# Patient Record
Sex: Female | Born: 1955 | Hispanic: No | Marital: Married | Smoking: Never smoker
Health system: Southern US, Community
[De-identification: ages and names within clinical notes are randomized; demographics above are authoritative.]

## PROBLEM LIST (undated history)

## (undated) DIAGNOSIS — E669 Obesity, unspecified: Secondary | ICD-10-CM

## (undated) DIAGNOSIS — I429 Cardiomyopathy, unspecified: Secondary | ICD-10-CM

## (undated) DIAGNOSIS — I4819 Other persistent atrial fibrillation: Secondary | ICD-10-CM

## (undated) DIAGNOSIS — I10 Essential (primary) hypertension: Secondary | ICD-10-CM

## (undated) HISTORY — PX: TONSILECTOMY, ADENOIDECTOMY, BILATERAL MYRINGOTOMY AND TUBES: SHX2538

## (undated) HISTORY — DX: Essential (primary) hypertension: I10

## (undated) HISTORY — DX: Cardiomyopathy, unspecified: I42.9

## (undated) HISTORY — DX: Obesity, unspecified: E66.9

## (undated) HISTORY — DX: Other persistent atrial fibrillation: I48.19

---

## 2020-06-09 ENCOUNTER — Other Ambulatory Visit: Payer: Self-pay

## 2020-06-09 ENCOUNTER — Encounter (HOSPITAL_COMMUNITY): Payer: Self-pay | Admitting: Cardiology

## 2020-06-09 ENCOUNTER — Inpatient Hospital Stay (HOSPITAL_COMMUNITY)
Admission: AD | Admit: 2020-06-09 | Discharge: 2020-06-12 | DRG: 308 | Disposition: A | Discharge status: Home / Self Care | Payer: Medicare Other | Attending: Cardiology | Admitting: Cardiology

## 2020-06-09 ENCOUNTER — Emergency Department (HOSPITAL_COMMUNITY): Payer: Medicare Other

## 2020-06-09 DIAGNOSIS — R7309 Other abnormal glucose: Secondary | ICD-10-CM | POA: Diagnosis not present

## 2020-06-09 DIAGNOSIS — I361 Nonrheumatic tricuspid (valve) insufficiency: Secondary | ICD-10-CM | POA: Diagnosis not present

## 2020-06-09 DIAGNOSIS — I4891 Unspecified atrial fibrillation: Secondary | ICD-10-CM | POA: Diagnosis present

## 2020-06-09 DIAGNOSIS — R197 Diarrhea, unspecified: Secondary | ICD-10-CM | POA: Diagnosis present

## 2020-06-09 DIAGNOSIS — I5041 Acute combined systolic (congestive) and diastolic (congestive) heart failure: Secondary | ICD-10-CM | POA: Diagnosis present

## 2020-06-09 DIAGNOSIS — Z6841 Body Mass Index (BMI) 40.0 and over, adult: Secondary | ICD-10-CM

## 2020-06-09 DIAGNOSIS — I248 Other forms of acute ischemic heart disease: Secondary | ICD-10-CM | POA: Diagnosis present

## 2020-06-09 DIAGNOSIS — D72829 Elevated white blood cell count, unspecified: Secondary | ICD-10-CM | POA: Diagnosis not present

## 2020-06-09 DIAGNOSIS — I2489 Other forms of acute ischemic heart disease: Secondary | ICD-10-CM

## 2020-06-09 DIAGNOSIS — I429 Cardiomyopathy, unspecified: Secondary | ICD-10-CM | POA: Diagnosis present

## 2020-06-09 DIAGNOSIS — R0602 Shortness of breath: Secondary | ICD-10-CM

## 2020-06-09 DIAGNOSIS — R739 Hyperglycemia, unspecified: Secondary | ICD-10-CM

## 2020-06-09 DIAGNOSIS — Z20822 Contact with and (suspected) exposure to covid-19: Secondary | ICD-10-CM | POA: Diagnosis present

## 2020-06-09 DIAGNOSIS — E1165 Type 2 diabetes mellitus with hyperglycemia: Secondary | ICD-10-CM | POA: Diagnosis present

## 2020-06-09 DIAGNOSIS — R001 Bradycardia, unspecified: Secondary | ICD-10-CM | POA: Diagnosis not present

## 2020-06-09 DIAGNOSIS — N3000 Acute cystitis without hematuria: Secondary | ICD-10-CM | POA: Diagnosis not present

## 2020-06-09 DIAGNOSIS — N390 Urinary tract infection, site not specified: Secondary | ICD-10-CM | POA: Diagnosis present

## 2020-06-09 DIAGNOSIS — I340 Nonrheumatic mitral (valve) insufficiency: Secondary | ICD-10-CM | POA: Diagnosis not present

## 2020-06-09 DIAGNOSIS — R0682 Tachypnea, not elsewhere classified: Secondary | ICD-10-CM | POA: Diagnosis not present

## 2020-06-09 LAB — CBC WITH DIFFERENTIAL/PLATELET
Abs Immature Granulocytes: 0.06 10*3/uL (ref 0.00–0.07)
Basophils Absolute: 0.1 10*3/uL (ref 0.0–0.1)
Basophils Relative: 1 %
Eosinophils Absolute: 0.1 10*3/uL (ref 0.0–0.5)
Eosinophils Relative: 1 %
HCT: 43.5 % (ref 36.0–46.0)
Hemoglobin: 13.2 g/dL (ref 12.0–15.0)
Immature Granulocytes: 1 %
Lymphocytes Relative: 8 %
Lymphs Abs: 1 10*3/uL (ref 0.7–4.0)
MCH: 27.7 pg (ref 26.0–34.0)
MCHC: 30.3 g/dL (ref 30.0–36.0)
MCV: 91.4 fL (ref 80.0–100.0)
Monocytes Absolute: 0.6 10*3/uL (ref 0.1–1.0)
Monocytes Relative: 5 %
Neutro Abs: 10.6 10*3/uL — ABNORMAL HIGH (ref 1.7–7.7)
Neutrophils Relative %: 84 %
Platelets: 349 10*3/uL (ref 150–400)
RBC: 4.76 MIL/uL (ref 3.87–5.11)
RDW: 15.4 % (ref 11.5–15.5)
WBC: 12.5 10*3/uL — ABNORMAL HIGH (ref 4.0–10.5)
nRBC: 0 % (ref 0.0–0.2)

## 2020-06-09 LAB — GLUCOSE, CAPILLARY
Glucose-Capillary: 166 mg/dL — ABNORMAL HIGH (ref 70–99)
Glucose-Capillary: 191 mg/dL — ABNORMAL HIGH (ref 70–99)

## 2020-06-09 LAB — BASIC METABOLIC PANEL
Anion gap: 12 (ref 5–15)
BUN: 19 mg/dL (ref 8–23)
CO2: 21 mmol/L — ABNORMAL LOW (ref 22–32)
Calcium: 8.9 mg/dL (ref 8.9–10.3)
Chloride: 108 mmol/L (ref 98–111)
Creatinine, Ser: 0.98 mg/dL (ref 0.44–1.00)
GFR calc Af Amer: >60 mL/min (ref >60)
GFR calc non Af Amer: >60 mL/min (ref >60)
Glucose, Bld: 244 mg/dL — ABNORMAL HIGH (ref 70–99)
Potassium: 4.7 mmol/L (ref 3.5–5.1)
Sodium: 141 mmol/L (ref 135–145)

## 2020-06-09 LAB — PROTIME-INR
INR: 1.2 (ref 0.8–1.2)
Prothrombin Time: 15 seconds (ref 11.4–15.2)

## 2020-06-09 LAB — BRAIN NATRIURETIC PEPTIDE: B Natriuretic Peptide: 319.8 pg/mL — ABNORMAL HIGH (ref 0.0–100.0)

## 2020-06-09 LAB — APTT: aPTT: 24 seconds (ref 24–36)

## 2020-06-09 LAB — TROPONIN I (HIGH SENSITIVITY)
Troponin I (High Sensitivity): 53 ng/L — ABNORMAL HIGH (ref <18)
Troponin I (High Sensitivity): 83 ng/L — ABNORMAL HIGH (ref <18)

## 2020-06-09 LAB — SARS CORONAVIRUS 2 BY RT PCR (HOSPITAL ORDER, PERFORMED IN ~~LOC~~ HOSPITAL LAB): SARS Coronavirus 2: NEGATIVE

## 2020-06-09 MED ORDER — HEPARIN (PORCINE) 25000 UT/250ML-% IV SOLN
1100.0000 [IU]/h | INTRAVENOUS | Status: DC
  Administered 2020-06-09: 1100 [IU]/h via INTRAVENOUS
  Filled 2020-06-09: qty 250

## 2020-06-09 MED ORDER — DILTIAZEM LOAD VIA INFUSION
10.0000 mg | Freq: Once | INTRAVENOUS | Status: AC
  Administered 2020-06-09: 10 mg via INTRAVENOUS
  Filled 2020-06-09: qty 10

## 2020-06-09 MED ORDER — HEPARIN (PORCINE) 25000 UT/250ML-% IV SOLN: 1100.0000 [IU]/h | INTRAVENOUS | Status: DC

## 2020-06-09 MED ORDER — ACETAMINOPHEN 325 MG PO TABS: 650.0000 mg | ORAL_TABLET | ORAL | Status: DC | PRN

## 2020-06-09 MED ORDER — DILTIAZEM HCL-DEXTROSE 125-5 MG/125ML-% IV SOLN (PREMIX)
5.0000 mg/h | INTRAVENOUS | Status: DC
  Administered 2020-06-09: 5 mg/h via INTRAVENOUS
  Filled 2020-06-09 (×2): qty 125

## 2020-06-09 MED ORDER — ONDANSETRON HCL 4 MG/2ML IJ SOLN: 4.0000 mg | Freq: Four times a day (QID) | INTRAMUSCULAR | Status: DC | PRN

## 2020-06-09 MED ORDER — HEPARIN BOLUS VIA INFUSION
4000.0000 [IU] | Freq: Once | INTRAVENOUS | Status: AC
  Administered 2020-06-09: 4000 [IU] via INTRAVENOUS
  Filled 2020-06-09: qty 4000

## 2020-06-09 MED ORDER — APIXABAN 5 MG PO TABS
5.0000 mg | ORAL_TABLET | Freq: Two times a day (BID) | ORAL | Status: DC
  Administered 2020-06-09 – 2020-06-12 (×7): 5 mg via ORAL
  Filled 2020-06-09 (×7): qty 1

## 2020-06-09 MED ORDER — METOPROLOL TARTRATE 25 MG PO TABS
25.0000 mg | ORAL_TABLET | Freq: Four times a day (QID) | ORAL | Status: DC
  Administered 2020-06-09 – 2020-06-10 (×3): 25 mg via ORAL
  Filled 2020-06-09 (×3): qty 1

## 2020-06-09 NOTE — H&P (Signed)
Cardiology Admission History and Physical:   Patient ID: Anita Stanton MRN: [id number]; DOB: Aug 23, 1956   Admission date: 06/09/2020  Primary Care Provider: Patient, No Pcp Per [facility] Cardiologist: Jodelle Red, MD [facility] Electrophysiologist:  None   Chief Complaint:  Atrial fibrillation with RVR  Patient Profile:   Anita Stanton is a 64 y.o. female without significant PMH who presented in atrial fibrillation with RVR  History of Present Illness:   Ms. Anita Stanton is without significant past medical history. She presented after 4 days of fatigue and shortness of breath. She attributed this to changing her cat's clay litter box, but on arrival she was found to be in atrial fibrillation with RVR. She denies prior cardiac history.  She has has shortness of breath, nausea, diarrhea, and fatigue for several days. Her symptoms worsened overnight, and she called EMS. She reports being given adenosine x2 with only brief improvement in her heart rate. Per EMS notes, this documented that her rhythm was atrial fibrillation and not SVT or flutter.    History reviewed. No pertinent past medical history.  History reviewed. No pertinent surgical history.   Medications Prior to Admission: Prior to Admission medications   Medication Sig Start Date End Date Taking? Authorizing Provider  Ascorbic Acid (VITAMIN C) 1000 MG tablet Take 1,000 mg by mouth daily.   Yes [provider]  B Complex Vitamins (VITAMIN-B COMPLEX PO) Take 1 tablet by mouth daily.   Yes [provider]  Lactobacillus (PROBIOTIC ACIDOPHILUS) CAPS Take 1 capsule by mouth daily.   Yes [provider]  Magnesium 100 MG TABS Take 100 mg by mouth daily.   Yes [provider]  Melatonin 2.5 MG CHEW Chew 1.25 mg by mouth at bedtime.   Yes [provider]  Zinc 10 MG LOZG Use as directed 10 mg in the mouth or throat daily as needed (cold symptoms).   Yes  [provider]     Allergies:   No Known Allergies  Social History:   Social History   Tobacco Use  . Smoking status: Not on file  Substance Use Topics  . Alcohol use: Not on file  . Drug use: Not on file   Family History:   No history of premature CAD  ROS:  Please see the history of present illness.  Constitutional: Negative for chills, fever, night sweats, unintentional weight loss  HENT: Negative for ear pain and hearing loss.   Eyes: Negative for loss of vision and eye pain.  Respiratory: Negative for sputum, wheezing.  Positive for cough Cardiovascular: See HPI. Gastrointestinal: Negative for abdominal pain, melena, and hematochezia. Positive for nausea and diarrhea Genitourinary: Negative for dysuria and hematuria.  Musculoskeletal: Negative for falls and myalgias.  Skin: Negative for itching and rash.  Neurological: Negative for focal weakness, focal sensory changes and loss of consciousness.  Endo/Heme/Allergies: Does not bruise/bleed easily.  All other ROS reviewed and negative.     Physical Exam/Data:   Vitals:   06/09/20 1203 06/09/20 1204 06/09/20 1205 06/09/20 1206  BP:      Pulse: (!) 135 (!) 141 (!) 136 (!) 146  Resp:      Temp:      TempSrc:      SpO2: 94% 96% 94% 94%  Weight:      Height:       No intake or output data in the 24 hours ending 06/09/20 1251 Last 3 Weights 06/09/2020  Weight (lbs) 215 lb  Weight (kg) 97.523 kg     Body mass index is 39.32 kg/m.  General:  Well nourished, well developed, in no acute distress HEENT: normal Lymph: no adenopathy Neck: no JVD Endocrine:  No thryomegaly Vascular: No carotid bruits; RA pulses 2+ bilaterally Cardiac:  normal S1, S2; tachycardic and irregular; no murmur appreciated Lungs:  clear to auscultation bilaterally, no wheezing, rhonchi or rales  Abd: soft, nontender, no hepatomegaly  Ext: no edema Musculoskeletal:  No deformities, BUE and BLE strength normal and equal Skin: warm  and dry  Neuro:  CNs 2-12 intact, no focal abnormalities noted Psych:  Normal affect    EKG:  The ECG that was done in the ER was personally reviewed and demonstrates afib RVR at 158 bpm  Relevant CV Studies: None  Laboratory Data:  High Sensitivity Troponin:   Recent Labs  Lab 06/09/20 0611 06/09/20 0803  TROPONINIHS 53* 83*      Chemistry Recent Labs  Lab 06/09/20 0611  NA 141  K 4.7  CL 108  CO2 21*  GLUCOSE 244*  BUN 19  CREATININE 0.98  CALCIUM 8.9  GFRNONAA >60  GFRAA >60  ANIONGAP 12    No results for input(s): PROT, ALBUMIN, AST, ALT, ALKPHOS, BILITOT in the last 168 hours. Hematology Recent Labs  Lab 06/09/20 0611  WBC 12.5*  RBC 4.76  HGB 13.2  HCT 43.5  MCV 91.4  MCH 27.7  MCHC 30.3  RDW 15.4  PLT 349   BNP Recent Labs  Lab 06/09/20 0611  BNP 319.8*    DDimer No results for input(s): DDIMER in the last 168 hours.   Radiology/Studies:  DG Chest Port 1 View  Result Date: 06/09/2020 CLINICAL DATA:  Shortness of breath EXAM: PORTABLE CHEST 1 VIEW COMPARISON:  None. FINDINGS: Lungs are clear. Heart is mildly enlarged with pulmonary vascularity normal. No adenopathy. Aorta is mildly tortuous. No bone lesions. IMPRESSION: Mild cardiac enlargement. Tortuous aorta; question chronic hypertension. No edema or airspace opacity. Electronically Signed   By: Lowella Grip III M.D.   On: 06/09/2020 07:52    Assessment and Plan:   Atrial fibrillation with RVR: new onset -transition from heparin drip to apixaban -on diltiazem drip, titrate to heart rate <90. -will add metoprolol. Has plenty of blood pressure room -discussed atrial fibrillation at length: Afib is abnormal rhythm of the top chamber of the heart. It can be triggered by many different things, including stress, infection, surgery, age, etc. The rhythm itself, when not fast, is not life threatening. We make sure the heart rate is not fast, and we talk about the risk of small blood clots  forming in the heart. When afib lasts for >48 hours, there is a risk of small clots forming and breaking loose, causing a stroke. We prevent this with blood thinning medication, but these medications also increase risk for bleeding. We discuss the balance between bleeding and stroke with each individual patient. -we discussed the difference between rate and rhythm control at length. Given the duration of her symptoms, suspect she has been in afib at least several days. Was not on anticoagulation prior. Will aim for rate control -CHA2DS2/VAS Stroke Risk Points= 2  -we will plan for TEE-CV on Monday -echo when heart rate is slower  Nausea, diarrhea, shortness of breath -unclear if an illness precipitated her afib, or if the afib resulted in her symptoms -concern for Covid given the constellation, denies fever -Covid swab pending.  Elevated troponin: low level, suspect demand ischemia  in the setting of atrial fibrillation with RVR  Severity of Illness: The appropriate patient status for this patient is INPATIENT. Inpatient status is judged to be reasonable and necessary in order to provide the required intensity of service to ensure the patient's safety. The patient's presenting symptoms, physical exam findings, and initial radiographic and laboratory data in the context of their chronic comorbidities is felt to place them at high risk for further clinical deterioration. Furthermore, it is not anticipated that the patient will be medically stable for discharge from the hospital within 2 midnights of admission. The following factors support the patient status of inpatient.   " The patient's presenting symptoms include nausea, shortness of breath, tachycardia. " The worrisome physical exam findings include rapid heart rate and shortness of breath. " The initial radiographic and laboratory data are worrisome because of likely demand ischemia. " The chronic co-morbidities include none.   * I certify  that at the point of admission it is my clinical judgment that the patient will require inpatient hospital care spanning beyond 2 midnights from the point of admission due to high intensity of service, high risk for further deterioration and high frequency of surveillance required.*    For questions or updates, please contact [facility] Please consult [email or web address] for contact info under     Signed, Jodelle Red, MD  06/09/2020 12:51 PM

## 2020-06-09 NOTE — ED Notes (Signed)
Pt. Transported via wheelchair to restroom.

## 2020-06-09 NOTE — Progress Notes (Signed)
ANTICOAGULATION CONSULT NOTE - Initial Consult  Pharmacy Consult for heparin Indication: atrial fibrillation  Patient Measurements: Height: 5\' 2"  (157.5 cm) Weight: 97.5 kg (215 lb) IBW/kg (Calculated) : 50.1 Heparin Dosing Weight: 75kg  Vital Signs: Temp: 97.9 F (36.6 C) (06/12 0616) Temp Source: Oral (06/12 0616) BP: 154/139 (06/12 0616) Pulse Rate: 159 (06/12 [birth year]1970)   Assessment: 64yo female with no PMH c/o SOB x3d that worsened overnight, found to be in Afib w/ RVR to the 190s, to begin heparin.  Goal of Therapy:  Heparin level 0.3-0.7 units/ml Monitor platelets by anticoagulation protocol: Yes   Plan:  Will give heparin 4000 units IV bolus x1 followed by gtt at 1100 units/hr and monitor heparin levels and CBC.  63yo, PharmD, BCPS  06/09/2020,6:25 AM

## 2020-06-09 NOTE — ED Triage Notes (Signed)
Transported via [facility]. Pt. C/o Sob x3, said she thinks it is caused by her cats litter. Pt. Currently A&O x4, tachy HR 140  Denies nausea

## 2020-06-09 NOTE — Progress Notes (Signed)
Cardizem drip stopped due to HR dropping into the 40's briefly on telemetry then coming back up to 60's. Now maintaining HR in the 60's.

## 2020-06-09 NOTE — Progress Notes (Signed)
Heart rate is now in afib in the 60's-70's and bp down to 114/88, decreased Cardizem gtt to 5 mg/hr.

## 2020-06-09 NOTE — ED Notes (Signed)
Cardiologist at bedside.  

## 2020-06-09 NOTE — ED Provider Notes (Signed)
[facility] EMERGENCY DEPARTMENT Provider Note   CSN: [id number] Arrival date & time: 06/09/20  4315     History Chief Complaint  Patient presents with  . Shortness of Breath    Anita Stanton is a 64 y.o. female.  Patient presents to the emergency department for evaluation of shortness of breath.  Patient has noticed shortness of breath for at least the last 3 days.  Symptoms worsened tonight so she called EMS.  EMS found the patient to be exhibiting significant tachycardia, heart rate to 190 bpm.  Patient administered adenosine IV push for possible SVT which revealed the rhythm to be atrial fibrillation with rapid ventricular response.  Patient denies chest pain.  She has had nausea and vomiting associated with her symptoms.        No past medical history on file.  There are no problems to display for this patient.      OB History   No obstetric history on file.     No family history on file.  Social History   Tobacco Use  . Smoking status: Not on file  Substance Use Topics  . Alcohol use: Not on file  . Drug use: Not on file    Home Medications Prior to Admission medications   Not on File    Allergies    Patient has no allergy information on record.  Review of Systems   Review of Systems  Respiratory: Positive for shortness of breath.   Gastrointestinal: Positive for nausea and vomiting.  All other systems reviewed and are negative.   Physical Exam Updated Vital Signs BP (!) 154/139 (BP Location: Right Arm)   Pulse (!) 159   Temp 97.9 F (36.6 C) (Oral)   Resp (!) 21   Ht 5\' 2"  (1.575 m)   Wt 97.5 kg   SpO2 95%   BMI 39.32 kg/m   Physical Exam Vitals and nursing note reviewed.  Constitutional:      General: She is not in acute distress.    Appearance: Normal appearance. She is well-developed.  HENT:     Head: Normocephalic and atraumatic.     Right Ear: Hearing normal.     Left Ear: Hearing normal.     Nose:  Nose normal.  Eyes:     Conjunctiva/sclera: Conjunctivae normal.     Pupils: Pupils are equal, round, and reactive to light.  Cardiovascular:     Rate and Rhythm: Tachycardia present.     Heart sounds: S1 normal and S2 normal. No murmur heard.  No friction rub. No gallop.   Pulmonary:     Effort: Pulmonary effort is normal. No respiratory distress.     Breath sounds: Normal breath sounds.  Chest:     Chest wall: No tenderness.  Abdominal:     General: Bowel sounds are normal.     Palpations: Abdomen is soft.     Tenderness: There is no abdominal tenderness. There is no guarding or rebound. Negative signs include Murphy's sign and McBurney's sign.     Hernia: No hernia is present.  Musculoskeletal:        General: Normal range of motion.     Cervical back: Normal range of motion and neck supple.  Skin:    General: Skin is warm and dry.     Findings: No rash.  Neurological:     Mental Status: She is alert and oriented to person, place, and time.     GCS: GCS eye subscore is  4. GCS verbal subscore is 5. GCS motor subscore is 6.     Cranial Nerves: No cranial nerve deficit.     Sensory: No sensory deficit.     Coordination: Coordination normal.  Psychiatric:        Speech: Speech normal.        Behavior: Behavior normal.        Thought Content: Thought content normal.     ED Results / Procedures / Treatments   Labs (all labs ordered are listed, but only abnormal results are displayed) Labs Reviewed  CBC WITH DIFFERENTIAL/PLATELET  BASIC METABOLIC PANEL  BRAIN NATRIURETIC PEPTIDE  PROTIME-INR  APTT  TROPONIN I (HIGH SENSITIVITY)    EKG EKG Interpretation  Date/Time:  Saturday June 09 2020 06:12:53 EDT Ventricular Rate:  158 PR Interval:    QRS Duration: 94 QT Interval:  316 QTC Calculation: 503 R Axis:   -67 Text Interpretation: Atrial fibrillation with rapid V-rate Inferior infarct, old Probable anterior infarct, age indeterminate Baseline wander in lead(s) I II  III aVR aVL aVF V3 V4 V5 V6 No previous tracing Confirmed by Gilda Crease [id number]) on 06/09/2020 6:15:26 AM   Radiology No results found.  Procedures .Critical Care Performed by: Gilda Crease, MD Authorized by: Gilda Crease, MD   Critical care provider statement:    Critical care time (minutes):  35   Critical care time was exclusive of:  Separately billable procedures and treating other patients   Critical care was necessary to treat or prevent imminent or life-threatening deterioration of the following conditions: Arrhythmia.   Critical care was time spent personally by me on the following activities:  Ordering and performing treatments and interventions, ordering and review of laboratory studies, development of treatment plan with patient or surrogate, ordering and review of radiographic studies, pulse oximetry, evaluation of patient's response to treatment, re-evaluation of patient's condition, review of old charts and examination of patient   I assumed direction of critical care for this patient from another provider in my specialty: no     (including critical care time)  Medications Ordered in ED Medications  diltiazem (CARDIZEM) 1 mg/mL load via infusion 10 mg (has no administration in time range)    And  diltiazem (CARDIZEM) 125 mg in dextrose 5% 125 mL (1 mg/mL) infusion (has no administration in time range)    ED Course  I have reviewed the triage vital signs and the nursing notes.  Pertinent labs & imaging results that were available during my care of the patient were reviewed by me and considered in my medical decision making (see chart for details).    MDM Rules/Calculators/A&P                          Patient with no previous medical history presents to the emergency department for evaluation of shortness of breath.  Patient comes to the ER from home by EMS who identified the patient to be in atrial fibrillation with a rapid ventricular  response.  Patient not experiencing any chest pain.  No obvious ischemia on EKG.  Patient exhibiting heart rate up to 190 bpm.  She has not hypotensive or unstable.  Will initiate Cardizem drip.  Patient adamantly refuses Covid testing.  CHADS-VASc Score for Atrial Fibrillation Stroke Risk from [email or web address]  on 06/09/2020 ** All calculations should be rechecked by clinician prior to use **  RESULT SUMMARY: 2 points Stroke risk was 2.2% per year in >90,000  patients (the [place] Atrial Fibrillation Cohort Study) and 2.9% risk of stroke/TIA/systemic embolism.  One recommendation suggests a 0 score is "low" risk and may not require anticoagulation; a 1 score is "low-moderate" risk and should consider antiplatelet or anticoagulation, and score 2 or greater is "moderate-high" risk and should otherwise be an anticoagulation candidate.   INPUTS: Age --> 1 = 65-74 Sex --> 1 = Female CHF history --> 0 = No Hypertension history --> 0 = No Stroke/TIA/thromboembolism history --> 0 = No Vascular disease history (prior MI, peripheral artery disease, or aortic plaque) --> 0 = No Diabetes history --> 0 = No   This patients CHA2DS2-VASc Score and unadjusted Ischemic Stroke Rate (% per year) is equal to 2.2 % stroke rate/year from a score of 2  Above score calculated as 1 point each if present [CHF, HTN, DM, Vascular=MI/PAD/Aortic Plaque, Age if 65-74, or Female] Above score calculated as 2 points each if present [Age > 75, or Stroke/TIA/TE]  Final Clinical Impression(s) / ED Diagnoses Final diagnoses:  Atrial fibrillation with RVR [facility])    Rx / DC Orders ED Discharge Orders    None       Shakera Ebrahimi, Canary Brim, MD 06/09/20 [phone]

## 2020-06-10 ENCOUNTER — Inpatient Hospital Stay (HOSPITAL_COMMUNITY): Payer: Medicare Other

## 2020-06-10 DIAGNOSIS — R7309 Other abnormal glucose: Secondary | ICD-10-CM

## 2020-06-10 DIAGNOSIS — I361 Nonrheumatic tricuspid (valve) insufficiency: Secondary | ICD-10-CM

## 2020-06-10 DIAGNOSIS — I340 Nonrheumatic mitral (valve) insufficiency: Secondary | ICD-10-CM

## 2020-06-10 DIAGNOSIS — D72829 Elevated white blood cell count, unspecified: Secondary | ICD-10-CM

## 2020-06-10 DIAGNOSIS — R0682 Tachypnea, not elsewhere classified: Secondary | ICD-10-CM

## 2020-06-10 LAB — CBC
HCT: 44.3 % (ref 36.0–46.0)
Hemoglobin: 13.7 g/dL (ref 12.0–15.0)
MCH: 27.6 pg (ref 26.0–34.0)
MCHC: 30.9 g/dL (ref 30.0–36.0)
MCV: 89.3 fL (ref 80.0–100.0)
Platelets: 371 10*3/uL (ref 150–400)
RBC: 4.96 MIL/uL (ref 3.87–5.11)
RDW: 15.5 % (ref 11.5–15.5)
WBC: 15.3 10*3/uL — ABNORMAL HIGH (ref 4.0–10.5)
nRBC: 0 % (ref 0.0–0.2)

## 2020-06-10 LAB — BASIC METABOLIC PANEL
Anion gap: 9 (ref 5–15)
BUN: 28 mg/dL — ABNORMAL HIGH (ref 8–23)
CO2: 21 mmol/L — ABNORMAL LOW (ref 22–32)
Calcium: 9.2 mg/dL (ref 8.9–10.3)
Chloride: 108 mmol/L (ref 98–111)
Creatinine, Ser: 1.1 mg/dL — ABNORMAL HIGH (ref 0.44–1.00)
GFR calc Af Amer: >60 mL/min (ref >60)
GFR calc non Af Amer: 52 mL/min — ABNORMAL LOW (ref >60)
Glucose, Bld: 126 mg/dL — ABNORMAL HIGH (ref 70–99)
Potassium: 4.8 mmol/L (ref 3.5–5.1)
Sodium: 138 mmol/L (ref 135–145)

## 2020-06-10 LAB — ECHOCARDIOGRAM COMPLETE
Height: 62 in
Weight: 3562.63 oz

## 2020-06-10 LAB — LIPID PANEL
Cholesterol: 180 mg/dL (ref 0–200)
HDL: 53 mg/dL (ref >40)
LDL Cholesterol: 105 mg/dL — ABNORMAL HIGH (ref 0–99)
Total CHOL/HDL Ratio: 3.4 RATIO
Triglycerides: 109 mg/dL (ref <150)
VLDL: 22 mg/dL (ref 0–40)

## 2020-06-10 LAB — GLUCOSE, CAPILLARY
Glucose-Capillary: 125 mg/dL — ABNORMAL HIGH (ref 70–99)
Glucose-Capillary: 103 mg/dL — ABNORMAL HIGH (ref 70–99)
Glucose-Capillary: 128 mg/dL — ABNORMAL HIGH (ref 70–99)
Glucose-Capillary: 113 mg/dL — ABNORMAL HIGH (ref 70–99)

## 2020-06-10 LAB — URINALYSIS, COMPLETE (UACMP) WITH MICROSCOPIC
Bilirubin Urine: NEGATIVE
Glucose, UA: NEGATIVE mg/dL
Hgb urine dipstick: NEGATIVE
Ketones, ur: 5 mg/dL — AB
Nitrite: POSITIVE — AB
Protein, ur: 30 mg/dL — AB
Specific Gravity, Urine: 1.024 (ref 1.005–1.030)
pH: 5 (ref 5.0–8.0)

## 2020-06-10 LAB — HEMOGLOBIN A1C
Hgb A1c MFr Bld: 6 % — ABNORMAL HIGH (ref 4.8–5.6)
Mean Plasma Glucose: 125.5 mg/dL

## 2020-06-10 LAB — STREP PNEUMONIAE URINARY ANTIGEN: Strep Pneumo Urinary Antigen: NEGATIVE

## 2020-06-10 MED ORDER — INSULIN ASPART 100 UNIT/ML ~~LOC~~ SOLN: 0.0000 [IU] | Freq: Every day | SUBCUTANEOUS | Status: DC

## 2020-06-10 MED ORDER — METOPROLOL TARTRATE 50 MG PO TABS
50.0000 mg | ORAL_TABLET | Freq: Four times a day (QID) | ORAL | Status: DC
  Administered 2020-06-10 – 2020-06-11 (×4): 50 mg via ORAL
  Filled 2020-06-10 (×4): qty 1

## 2020-06-10 MED ORDER — FUROSEMIDE 10 MG/ML IJ SOLN
20.0000 mg | Freq: Once | INTRAMUSCULAR | Status: AC
  Administered 2020-06-10: 20 mg via INTRAVENOUS
  Filled 2020-06-10: qty 2

## 2020-06-10 MED ORDER — INSULIN ASPART 100 UNIT/ML ~~LOC~~ SOLN: 0.0000 [IU] | Freq: Three times a day (TID) | SUBCUTANEOUS | Status: DC

## 2020-06-10 NOTE — Progress Notes (Signed)
Progress Note  Patient Name: Anita Stanton Date of Encounter: 06/10/2020  [facility] Cardiologist: Jodelle Red, MD  Subjective   Has has isolated sugars >200, consistent with a new diagnosis of diabetes. She denies any prior history of this. Diltiazem drip stopped last night due to bradycardia. Tolerating metoprolol. Her white count is elevated to 15 today, will send strep/legionella and UA/culture. No fevers. Has a dry cough but nonproductive. No urinary symptoms.  Denies prior diagnosis of diabetes, reports recent A1c normal. Reviewed her blood sugars, insulin coverage while inpatient.  Discussed options for management. She would like to see if we can manage heart rate on medications alone vs. TEE-CV tomorrow, but she will think about it.  No chest pain, breathing improved, able to get some sleep.  Inpatient Medications    Scheduled Meds: . apixaban  5 mg Oral BID  . furosemide  20 mg Intravenous Once  . insulin aspart  0-5 Units Subcutaneous QHS  . insulin aspart  0-6 Units Subcutaneous TID WC  . metoprolol tartrate  50 mg Oral Q6H   Continuous Infusions:  PRN Meds: acetaminophen, ondansetron (ZOFRAN) IV   Vital Signs    Vitals:   06/09/20 1713 06/09/20 2022 06/10/20 0523 06/10/20 0749  BP: (!) 125/97 (!) 139/112 (!) 142/126 (!) 129/93  Pulse:  98 (!) 123 94  Resp:  20 16 16   Temp:  98.3 F (36.8 C) 98.3 F (36.8 C) (!) 97.4 F (36.3 C)  TempSrc:  Oral Oral Oral  SpO2:  98% 95% 95%  Weight:   101 kg   Height:        Intake/Output Summary (Last 24 hours) at 06/10/2020 0905 Last data filed at 06/10/2020 0400 Gross per 24 hour  Intake 1056.43 ml  Output 300 ml  Net 756.43 ml   Last 3 Weights 06/10/2020 06/09/2020 06/09/2020  Weight (lbs) 222 lb 10.6 oz 221 lb 8 oz 215 lb  Weight (kg) 101 kg 100.472 kg 97.523 kg      Telemetry    Atrial fibrillation, rates 90s-100s- Personally Reviewed  ECG    06/09/20 afib RVRV 158 bpm - Personally  Reviewed  Physical Exam   GEN: No acute distress.   O2 in place Neck: No JVD Cardiac: distant heart sounds, irregularly irregular, tachycardic Respiratory: crackles at bilateral bases today GI: Soft, nontender, non-distended  MS: No edema; No deformity. Neuro:  Nonfocal  Psych: Normal affect   Labs    High Sensitivity Troponin:   Recent Labs  Lab 06/09/20 0611 06/09/20 0803  TROPONINIHS 53* 83*      Chemistry Recent Labs  Lab 06/09/20 0611 06/10/20 0807  NA 141 138  K 4.7 4.8  CL 108 108  CO2 21* 21*  GLUCOSE 244* 126*  BUN 19 28*  CREATININE 0.98 1.10*  CALCIUM 8.9 9.2  GFRNONAA >60 52*  GFRAA >60 >60  ANIONGAP 12 9     Hematology Recent Labs  Lab 06/09/20 0611 06/10/20 0807  WBC 12.5* 15.3*  RBC 4.76 4.96  HGB 13.2 13.7  HCT 43.5 44.3  MCV 91.4 89.3  MCH 27.7 27.6  MCHC 30.3 30.9  RDW 15.4 15.5  PLT 349 371    BNP Recent Labs  Lab 06/09/20 0611  BNP 319.8*     DDimer No results for input(s): DDIMER in the last 168 hours.   Radiology    DG Chest Port 1 View  Result Date: 06/09/2020 CLINICAL DATA:  Shortness of breath EXAM: PORTABLE CHEST 1  VIEW COMPARISON:  None. FINDINGS: Lungs are clear. Heart is mildly enlarged with pulmonary vascularity normal. No adenopathy. Aorta is mildly tortuous. No bone lesions. IMPRESSION: Mild cardiac enlargement. Tortuous aorta; question chronic hypertension. No edema or airspace opacity. Electronically Signed   By: Lowella Grip III M.D.   On: 06/09/2020 07:52    Cardiac Studies   Echo pending  Patient Profile     64 y.o. female without significant PMH who presented with atrial fibrillation with RVR.  Assessment & Plan    Leukocytosis, dry cough, O2 requirement, crackles on exam -CXR clear, dry cough -no urinary symptoms, but endorses prior diarrhea -will check urine strep and legionella, UA -no fevers -nausea resolved -Covid negative -will give one time dose of IV lasix -would aim to  ambulate, wean O2 as tolerated  Elevated blood glucose -endorses prior A1c normal -glucose checks and SSI placed  Elevated troponin: -suspect demand ischemia in the setting of atrial fibrillation with RVR -echo ordered  Atrial fibrillation with RVR: new onset -continue apixaban -heart rate dipped on combination diltiazem/metoprolol. Stopped diltiazem drip -uptitrate metoprolol today to improve rate control -CHA2DS2/VAS Stroke Risk Points= 2  -we will plan for TEE-CV on Monday if we cannot rate control her. Otherwise she would be interested in rate control and return for cardioversion only in several weeks.  For questions or updates, please contact [place] Please consult [email or web address] for contact info under    Signed, Buford Dresser, MD  06/10/2020, 9:05 AM

## 2020-06-10 NOTE — Progress Notes (Signed)
*  PRELIMINARY RESULTS* Echocardiogram 2D Echocardiogram has been performed.  Stacey Drain 06/10/2020, 2:55 PM

## 2020-06-11 DIAGNOSIS — I248 Other forms of acute ischemic heart disease: Secondary | ICD-10-CM

## 2020-06-11 DIAGNOSIS — I429 Cardiomyopathy, unspecified: Secondary | ICD-10-CM

## 2020-06-11 DIAGNOSIS — N390 Urinary tract infection, site not specified: Secondary | ICD-10-CM

## 2020-06-11 DIAGNOSIS — I5041 Acute combined systolic (congestive) and diastolic (congestive) heart failure: Secondary | ICD-10-CM

## 2020-06-11 DIAGNOSIS — R739 Hyperglycemia, unspecified: Secondary | ICD-10-CM

## 2020-06-11 DIAGNOSIS — N3000 Acute cystitis without hematuria: Secondary | ICD-10-CM

## 2020-06-11 LAB — CBC
HCT: 41.1 % (ref 36.0–46.0)
Hemoglobin: 12.6 g/dL (ref 12.0–15.0)
MCH: 27.5 pg (ref 26.0–34.0)
MCHC: 30.7 g/dL (ref 30.0–36.0)
MCV: 89.5 fL (ref 80.0–100.0)
Platelets: 346 10*3/uL (ref 150–400)
RBC: 4.59 MIL/uL (ref 3.87–5.11)
RDW: 15.4 % (ref 11.5–15.5)
WBC: 12.5 10*3/uL — ABNORMAL HIGH (ref 4.0–10.5)
nRBC: 0 % (ref 0.0–0.2)

## 2020-06-11 LAB — BASIC METABOLIC PANEL
Anion gap: 10 (ref 5–15)
BUN: 38 mg/dL — ABNORMAL HIGH (ref 8–23)
CO2: 24 mmol/L (ref 22–32)
Calcium: 8.9 mg/dL (ref 8.9–10.3)
Chloride: 104 mmol/L (ref 98–111)
Creatinine, Ser: 0.89 mg/dL (ref 0.44–1.00)
GFR calc Af Amer: >60 mL/min (ref >60)
GFR calc non Af Amer: >60 mL/min (ref >60)
Glucose, Bld: 99 mg/dL (ref 70–99)
Potassium: 4.1 mmol/L (ref 3.5–5.1)
Sodium: 138 mmol/L (ref 135–145)

## 2020-06-11 LAB — GLUCOSE, CAPILLARY
Glucose-Capillary: 102 mg/dL — ABNORMAL HIGH (ref 70–99)
Glucose-Capillary: 110 mg/dL — ABNORMAL HIGH (ref 70–99)
Glucose-Capillary: 108 mg/dL — ABNORMAL HIGH (ref 70–99)
Glucose-Capillary: 147 mg/dL — ABNORMAL HIGH (ref 70–99)

## 2020-06-11 LAB — LEGIONELLA PNEUMOPHILA SEROGP 1 UR AG: L. pneumophila Serogp 1 Ur Ag: NEGATIVE

## 2020-06-11 MED ORDER — NITROFURANTOIN MONOHYD MACRO 100 MG PO CAPS
100.0000 mg | ORAL_CAPSULE | Freq: Two times a day (BID) | ORAL | Status: DC
  Administered 2020-06-11 – 2020-06-12 (×3): 100 mg via ORAL
  Filled 2020-06-11 (×3): qty 1

## 2020-06-11 MED ORDER — METOPROLOL SUCCINATE ER 100 MG PO TB24
200.0000 mg | ORAL_TABLET | Freq: Every day | ORAL | Status: DC
  Administered 2020-06-11 – 2020-06-12 (×2): 200 mg via ORAL
  Filled 2020-06-11 (×2): qty 2

## 2020-06-11 MED ORDER — FUROSEMIDE 10 MG/ML IJ SOLN
40.0000 mg | Freq: Two times a day (BID) | INTRAMUSCULAR | Status: DC
  Administered 2020-06-11 (×2): 40 mg via INTRAVENOUS
  Filled 2020-06-11 (×3): qty 4

## 2020-06-11 NOTE — Plan of Care (Signed)
  Problem: Education: Goal: Knowledge of disease or condition will improve Outcome: Progressing Goal: Understanding of medication regimen will improve Outcome: Progressing   Problem: Activity: Goal: Ability to tolerate increased activity will improve Outcome: Progressing   Problem: Cardiac: Goal: Ability to achieve and maintain adequate cardiopulmonary perfusion will improve Outcome: Progressing Remains in Afib with rate controlled. BP trends down with DBP 80's-100"s

## 2020-06-11 NOTE — Progress Notes (Signed)
Progress Note  Patient Name: Anita Stanton Date of Encounter: 06/11/2020  [facility] Cardiologist: Buford Dresser, MD  Subjective   Spent >50 minute today reviewing UA results, echo results, and options for next steps. She is feeling somewhat better, though still with dry cough and shortness of breath. No chest pain. Discussed plan at length, see below.  Inpatient Medications    Scheduled Meds: . apixaban  5 mg Oral BID  . furosemide  40 mg Intravenous BID  . insulin aspart  0-5 Units Subcutaneous QHS  . insulin aspart  0-6 Units Subcutaneous TID WC  . metoprolol succinate  200 mg Oral Daily  . nitrofurantoin (macrocrystal-monohydrate)  100 mg Oral Q12H   Continuous Infusions:  PRN Meds: acetaminophen   Vital Signs    Vitals:   06/10/20 2048 06/10/20 2339 06/11/20 0618 06/11/20 0733  BP: (!) 125/95 (!) 146/104 123/87 (!) 148/116  Pulse: (!) 52 77 80   Resp:      Temp: (!) 97.4 F (36.3 C) 97.8 F (36.6 C) 98.2 F (36.8 C) 97.7 F (36.5 C)  TempSrc: Oral Oral Oral Oral  SpO2: 96% 100% 99%   Weight:   100.7 kg   Height:        Intake/Output Summary (Last 24 hours) at 06/11/2020 0828 Last data filed at 06/11/2020 0800 Gross per 24 hour  Intake 220 ml  Output 800 ml  Net -580 ml   Last 3 Weights 06/11/2020 06/10/2020 06/09/2020  Weight (lbs) 221 lb 14.4 oz 222 lb 10.6 oz 221 lb 8 oz  Weight (kg) 100.653 kg 101 kg 100.472 kg      Telemetry    Atrial fibrillation, rates 70s-90s- Personally Reviewed  ECG    06/09/20 afib RVR 158 bpm - Personally Reviewed  Physical Exam   GEN: Well nourished, well developed in no acute distress. O2 by [place] in place HEENT: Normal, moist mucous membranes NECK: No JVD appreciated, but difficult body habitus CARDIAC: irregular rhythm, normal S1 and S2, no rubs or gallops. No murmur appreciated. VASCULAR: Radial and DP pulses 2+ bilaterally. No carotid bruits RESPIRATORY:  Bilateral basilar rales  present. ABDOMEN: Soft, non-tender, non-distended MUSCULOSKELETAL:  Moves all 4 limbs independently SKIN: Warm and dry, trivial LE edema NEUROLOGIC:  Alert and oriented x 3. No focal neuro deficits noted. PSYCHIATRIC:  Normal affect   Labs    High Sensitivity Troponin:   Recent Labs  Lab 06/09/20 0611 06/09/20 0803  TROPONINIHS 53* 83*      Chemistry Recent Labs  Lab 06/09/20 0611 06/10/20 0807 06/11/20 0510  NA 141 138 138  K 4.7 4.8 4.1  CL 108 108 104  CO2 21* 21* 24  GLUCOSE 244* 126* 99  BUN 19 28* 38*  CREATININE 0.98 1.10* 0.89  CALCIUM 8.9 9.2 8.9  GFRNONAA >60 52* >60  GFRAA >60 >60 >60  ANIONGAP 12 9 10      Hematology Recent Labs  Lab 06/09/20 0611 06/10/20 0807 06/11/20 0510  WBC 12.5* 15.3* 12.5*  RBC 4.76 4.96 4.59  HGB 13.2 13.7 12.6  HCT 43.5 44.3 41.1  MCV 91.4 89.3 89.5  MCH 27.7 27.6 27.5  MCHC 30.3 30.9 30.7  RDW 15.4 15.5 15.4  PLT 349 371 346    BNP Recent Labs  Lab 06/09/20 0611  BNP 319.8*     DDimer No results for input(s): DDIMER in the last 168 hours.   Radiology    ECHOCARDIOGRAM COMPLETE  Result Date: 06/10/2020  ECHOCARDIOGRAM REPORT   Patient Name:   Anita Stanton Date of Exam: 06/10/2020 Medical Rec #:  [id number]           Height:       62.0 in Accession #:    [id number]          Weight:       222.7 lb Date of Birth:  08/12/1956           BSA:          2.001 m Patient Age:    64 years            BP:           129/93 mmHg Patient Gender: F                   HR:           94 bpm. Exam Location:  Inpatient Procedure: 2D Echo, Cardiac Doppler and Color Doppler Indications:    Atrial Fibrillation 427.31 / I48.91  History:        Patient has no prior history of Echocardiogram examinations.                 Arrythmias:Atrial Fibrillation.  Sonographer:    Celesta Gentile RCS Referring Phys: [id number] Keyton Bhat IMPRESSIONS  1. LVEF is severely depressed The proximal portion of LV contracts an distal 2/3 is severely  hypokinetic/akinetic . Left ventricular ejection fraction, by estimation, is 25%%. The left ventricle has severely decreased function. The left ventricle demonstrates regional wall motion abnormalities (see scoring diagram/findings for description). There is mild left ventricular hypertrophy. Left ventricular diastolic parameters are indeterminate.  2. Proximal portion of RV contracts; mid/distal is akinetic/dyskinetic.. Right ventricular systolic function is severely reduced. The right ventricular size is normal. There is mildly elevated pulmonary artery systolic pressure.  3. Left atrial size was moderately dilated.  4. Right atrial size was moderately dilated.  5. The mitral valve is abnormal. Mild mitral valve regurgitation.  6. The aortic valve is abnormal. Aortic valve regurgitation is trivial. Mild aortic valve sclerosis is present, with no evidence of aortic valve stenosis.  7. The inferior vena cava is dilated in size with <50% respiratory variability, suggesting right atrial pressure of 15 mmHg. FINDINGS  Left Ventricle: LVEF is severely depressed The proximal portion of LV contracts an distal 2/3 is severely hypokinetic/akinetic. Left ventricular ejection fraction, by estimation, is 25%%. The left ventricle has severely decreased function. The left ventricle demonstrates regional wall motion abnormalities. The left ventricular internal cavity size was normal in size. There is mild left ventricular hypertrophy. Left ventricular diastolic parameters are indeterminate. Right Ventricle: Proximal portion of RV contracts; mid/distal is akinetic/dyskinetic. The right ventricular size is normal. Right vetricular wall thickness was not assessed. Right ventricular systolic function is severely reduced. There is mildly elevated pulmonary artery systolic pressure. The tricuspid regurgitant velocity is 2.46 m/s, and with an assumed right atrial pressure of 15 mmHg, the estimated right ventricular systolic pressure is  39.2 mmHg. Left Atrium: Left atrial size was moderately dilated. Right Atrium: Right atrial size was moderately dilated. Pericardium: There is no evidence of pericardial effusion. Mitral Valve: The mitral valve is abnormal. There is mild thickening of the mitral valve leaflet(s). Mild mitral valve regurgitation. Tricuspid Valve: The tricuspid valve is normal in structure. Tricuspid valve regurgitation is mild. Aortic Valve: The aortic valve is abnormal. Aortic valve regurgitation is trivial. Aortic regurgitation PHT measures 552 msec. Mild aortic valve sclerosis is  present, with no evidence of aortic valve stenosis. Pulmonic Valve: The pulmonic valve was normal in structure. Pulmonic valve regurgitation is not visualized. Aorta: The aortic root and ascending aorta are structurally normal, with no evidence of dilitation. Venous: The inferior vena cava is dilated in size with less than 50% respiratory variability, suggesting right atrial pressure of 15 mmHg. IAS/Shunts: No atrial level shunt detected by color flow Doppler.  LEFT VENTRICLE PLAX 2D LVIDd:         4.90 cm LVIDs:         4.00 cm LV PW:         1.10 cm LV IVS:        1.40 cm LVOT diam:     2.40 cm LV SV:         45 LV SV Index:   22 LVOT Area:     4.52 cm  LV Volumes (MOD) LV vol d, MOD A2C: 79.2 ml LV vol d, MOD A4C: 90.9 ml LV vol s, MOD A2C: 44.4 ml LV vol s, MOD A4C: 51.8 ml LV SV MOD A2C:     34.8 ml LV SV MOD A4C:     90.9 ml LV SV MOD BP:      37.0 ml RIGHT VENTRICLE TAPSE (M-mode): 1.5 cm LEFT ATRIUM             Index       RIGHT ATRIUM           Index LA diam:        3.40 cm 1.70 cm/m  RA Area:     19.50 cm LA Vol (A2C):   99.7 ml 49.83 ml/m RA Volume:   51.60 ml  25.79 ml/m LA Vol (A4C):   86.2 ml 43.08 ml/m LA Biplane Vol: 96.9 ml 48.43 ml/m  AORTIC VALVE LVOT Vmax:   66.60 cm/s LVOT Vmean:  45.000 cm/s LVOT VTI:    0.099 m AI PHT:      552 msec  AORTA Ao Root diam: 3.70 cm MITRAL VALVE               TRICUSPID VALVE MV Area (PHT): 5.02  cm    TR Peak grad:   24.2 mmHg MV Decel Time: 151 msec    TR Vmax:        246.00 cm/s MV E velocity: 94.00 cm/s                            SHUNTS                            Systemic VTI:  0.10 m                            Systemic Diam: 2.40 cm Dietrich Pates MD Electronically signed by Dietrich Pates MD Signature Date/Time: 06/10/2020/4:08:22 PM    Final     Cardiac Studies   Echo 06/11/20  1. LVEF is severely depressed The proximal portion of LV contracts an  distal 2/3 is severely hypokinetic/akinetic . Left ventricular ejection  fraction, by estimation, is 25%%. The left ventricle has severely  decreased function. The left ventricle  demonstrates regional wall motion abnormalities (see scoring  diagram/findings for description). There is mild left ventricular  hypertrophy. Left ventricular diastolic parameters are indeterminate.  2. Proximal portion of RV contracts; mid/distal is akinetic/dyskinetic.Marland Kitchen  Right ventricular systolic function is severely reduced. The right  ventricular size is normal. There is mildly elevated pulmonary artery  systolic pressure.  3. Left atrial size was moderately dilated.  4. Right atrial size was moderately dilated.  5. The mitral valve is abnormal. Mild mitral valve regurgitation.  6. The aortic valve is abnormal. Aortic valve regurgitation is trivial.  Mild aortic valve sclerosis is present, with no evidence of aortic valve  stenosis.  7. The inferior vena cava is dilated in size with <50% respiratory  variability, suggesting right atrial pressure of 15 mmHg.   Patient Profile     64 y.o. female without significant PMH who presented with atrial fibrillation with RVR. Found to have new cardiomyopathy.  Assessment & Plan    Leukocytosis, dry cough, O2 requirement, crackles on exam -white count down this AM -strep pneumo negative -CXR clear, dry cough, but crackles on exam -legionella pending -urine culture pending -UA with positive nitrite,  small leukocytes, small protein/ketones, many bacteria. Consistent with urinary tract infection. Does not have other symptoms/high risk factors, but given that she is hospitalized, discussed recommended antibiotics -discussed treatment options, she would prefer most mild antibiotic with least systemic effects. Will start macrobid, discussed side effects, change based on culture if needed -no fevers -nausea resolved -Covid negative -see below re: volume overload  New cardiomyopathy, acute systolic and diastolic heart failure -We reviewed echo results at length. We discussed possible etiologies of reduced ejection fraction, specifically discussing the difference between ischemic and non-ischemic cardiomyopathy. We discussed how we typically work up a new cardiomyopathy, including right and left heart cath with coronary angiography. We also discussed recommended medical management of cardiomyopathy and clinical signs of heart failure. -she declines cath at this time. Would prefer to pursue medical management and then recheck echo in the future -still has crackles in the lungs. Received 20 mg IV lasix at 11:48 yesterday. Net even. Will increase to IV lasix 40 mg BID -she thinks she holds fluid in her abdomen. Will plan to use torsemide at discharge to avoid gut absorption issues with lasix. -consolidated to metoprolol succinate today -discussed additional heart protective medications. BP slightly elevated today. She states that she is usually much lower, worried about hypotension. Would add ARB when able (hesistant about new medications, such as entresto)  Elevated blood glucose -endorses prior A1c normal. A1c 6.0 here -will need outpatient follow up and monitoring -glucose checks and SSI placed -consider SGLT2i or GLP1RA  Elevated troponin: -suspect demand ischemia in the setting of atrial fibrillation with RVR given low level and trend -echo shows severely depressed LVEF, see above.  Atrial  fibrillation with RVR: new onset -continue apixaban -consolidated metoprolol to succinate today, rate control improved -CHA2DS2/VAS Stroke Risk Points= 2  -discussed TEE-CV, she declines today. Would like to pursue medical management and possible cardioversion alone in the future  For questions or updates, please contact [facility] Please consult [email or web address] for contact info under    Signed, Jodelle Red, MD  06/11/2020, 8:28 AM

## 2020-06-11 NOTE — TOC Benefit Eligibility Note (Signed)
Transition of Care [facility]) Benefit Eligibility Note    Patient Details  Name: Anita Stanton MRN: [id number] Date of Birth: 1956/06/11   Medication/Dose: Eliquis 5mg  bid  Covered?: Yes  Prescription Coverage Preferred Pharmacy: , [facility], and [facility]  Spoke with Person/Company/Phone Number:: [employer]  Co-Pay: [phone] for first fill, after deductible is paid, co-pay will be $35 for 30 day retail  Prior Approval: No  Deductible: Unmet ($330 remaining)        $482 Phone Number: 06/11/2020, 11:12 AM

## 2020-06-11 NOTE — Discharge Instructions (Addendum)
Do the following things EVERY DAY:  1) Weigh yourself EVERY morning after you go to the bathroom but before you eat or drink anything. Write this number down in a weight log/diary. If you gain 3 pounds overnight or 5 pounds in a week, call the office.  2) Take your medicines as prescribed. If you have concerns about your medications, please call us before you stop taking them.   3) Eat low salt foods--Limit salt (sodium) to 2000 mg per day. This will help prevent your body from holding onto fluid. Read food labels as many processed foods have a lot of sodium, especially canned goods and prepackaged meats. If you would like some assistance choosing low sodium foods, we would be happy to set you up with a nutritionist.  4) Stay as active as you can everyday. Staying active will give you more energy and make your muscles stronger. Start with 5 minutes at a time and work your way up to 30 minutes a day. Break up your activities--do some in the morning and some in the afternoon. Start with 3 days per week and work your way up to 5 days as you can.  If you have chest pain, feel short of breath, dizzy, or lightheaded, STOP. If you don't feel better after a short rest, call 911. If you do feel better, call the office to let us know you have symptoms with exercise.  5) Limit all fluids for the day to less than 2 liters. Fluid includes all drinks, coffee, juice, ice chips, soup, jello, and all other liquids.  Information on my medicine - ELIQUIS (apixaban)  This medication education was reviewed with me or my healthcare representative as part of my discharge preparation.    Why was Eliquis prescribed for you? Eliquis was prescribed for you to reduce the risk of a blood clot forming that can cause a stroke if you have a medical condition called atrial fibrillation (a type of irregular heartbeat).  What do You need to know about Eliquis ? Take your Eliquis TWICE DAILY - one tablet in the morning and one  tablet in the evening with or without food. If you have difficulty swallowing the tablet whole please discuss with your pharmacist how to take the medication safely.  Take Eliquis exactly as prescribed by your doctor and DO NOT stop taking Eliquis without talking to the doctor who prescribed the medication.  Stopping may increase your risk of developing a stroke.  Refill your prescription before you run out.  After discharge, you should have regular check-up appointments with your healthcare provider that is prescribing your Eliquis.  In the future your dose may need to be changed if your kidney function or weight changes by a significant amount or as you get older.  What do you do if you miss a dose? If you miss a dose, take it as soon as you remember on the same day and resume taking twice daily.  Do not take more than one dose of ELIQUIS at the same time to make up a missed dose.  Important Safety Information A possible side effect of Eliquis is bleeding. You should call your healthcare provider right away if you experience any of the following: ? Bleeding from an injury or your nose that does not stop. ? Unusual colored urine (red or dark brown) or unusual colored stools (red or black). ? Unusual bruising for unknown reasons. ? A serious fall or if you hit your head (even if there  is no bleeding).  Some medicines may interact with Eliquis and might increase your risk of bleeding or clotting while on Eliquis. To help avoid this, consult your healthcare provider or pharmacist prior to using any new prescription or non-prescription medications, including herbals, vitamins, non-steroidal anti-inflammatory drugs (NSAIDs) and supplements.  This website has more information on Eliquis (apixaban): http://www.eliquis.com/eliquis/home

## 2020-06-11 NOTE — Care Management (Signed)
Entered benefit check for Eliquis 5mg  PO BID x 30 days. Changed pharmacy to [facility] , they will provide first 30 days free.   [facility] RN

## 2020-06-12 LAB — BASIC METABOLIC PANEL
Anion gap: 12 (ref 5–15)
BUN: 37 mg/dL — ABNORMAL HIGH (ref 8–23)
CO2: 26 mmol/L (ref 22–32)
Calcium: 9 mg/dL (ref 8.9–10.3)
Chloride: 103 mmol/L (ref 98–111)
Creatinine, Ser: 1.03 mg/dL — ABNORMAL HIGH (ref 0.44–1.00)
GFR calc Af Amer: >60 mL/min (ref >60)
GFR calc non Af Amer: 57 mL/min — ABNORMAL LOW (ref >60)
Glucose, Bld: 107 mg/dL — ABNORMAL HIGH (ref 70–99)
Potassium: 3.6 mmol/L (ref 3.5–5.1)
Sodium: 141 mmol/L (ref 135–145)

## 2020-06-12 LAB — CBC
HCT: 42.4 % (ref 36.0–46.0)
Hemoglobin: 13.2 g/dL (ref 12.0–15.0)
MCH: 27.6 pg (ref 26.0–34.0)
MCHC: 31.1 g/dL (ref 30.0–36.0)
MCV: 88.7 fL (ref 80.0–100.0)
Platelets: 308 10*3/uL (ref 150–400)
RBC: 4.78 MIL/uL (ref 3.87–5.11)
RDW: 15.2 % (ref 11.5–15.5)
WBC: 10 10*3/uL (ref 4.0–10.5)
nRBC: 0 % (ref 0.0–0.2)

## 2020-06-12 LAB — URINE CULTURE: Culture: >=100000 — AB

## 2020-06-12 LAB — GLUCOSE, CAPILLARY
Glucose-Capillary: 108 mg/dL — ABNORMAL HIGH (ref 70–99)
Glucose-Capillary: 125 mg/dL — ABNORMAL HIGH (ref 70–99)

## 2020-06-12 MED ORDER — NITROFURANTOIN MONOHYD MACRO 100 MG PO CAPS: 100.0000 mg | ORAL_CAPSULE | Freq: Two times a day (BID) | ORAL | 0 refills | Status: DC

## 2020-06-12 MED ORDER — FUROSEMIDE 40 MG PO TABS
40.0000 mg | ORAL_TABLET | Freq: Every day | ORAL | Status: DC
  Administered 2020-06-12: 40 mg via ORAL
  Filled 2020-06-12: qty 1

## 2020-06-12 MED ORDER — METOPROLOL SUCCINATE ER 200 MG PO TB24: 200.0000 mg | ORAL_TABLET | Freq: Every day | ORAL | 6 refills | Status: DC

## 2020-06-12 MED ORDER — LOSARTAN POTASSIUM 25 MG PO TABS: 12.5000 mg | ORAL_TABLET | Freq: Every day | ORAL | 3 refills | Status: DC

## 2020-06-12 MED ORDER — LOSARTAN POTASSIUM 25 MG PO TABS
12.5000 mg | ORAL_TABLET | Freq: Every day | ORAL | Status: DC
  Administered 2020-06-12: 12.5 mg via ORAL
  Filled 2020-06-12: qty 1

## 2020-06-12 MED ORDER — APIXABAN 5 MG PO TABS: 5.0000 mg | ORAL_TABLET | Freq: Two times a day (BID) | ORAL | 11 refills | Status: DC

## 2020-06-12 MED ORDER — FUROSEMIDE 40 MG PO TABS: 40.0000 mg | ORAL_TABLET | Freq: Every day | ORAL | 1 refills | Status: DC

## 2020-06-12 MED FILL — ELIQUIS 5 MG TABLET: 5 | 30 days supply | Qty: 60 | Fill #0

## 2020-06-12 MED FILL — METOPROLOL SUCCINATE ER 200: 200 | 30 days supply | Qty: 30 | Fill #0

## 2020-06-12 MED FILL — LOSARTAN POTASSIUM 25 MG TA: 25 | 60 days supply | Qty: 30 | Fill #0

## 2020-06-12 MED FILL — NITROFURANTOIN MONO-MCR 100: 100 | 3 days supply | Qty: 7 | Fill #0

## 2020-06-12 MED FILL — FUROSEMIDE 40 MG TABLET: 40 | 30 days supply | Qty: 30 | Fill #0

## 2020-06-12 NOTE — Discharge Summary (Signed)
Discharge Summary    Patient ID: Anita Stanton MRN: [id number]; DOB: 06/24/1956  Admit date: 06/09/2020 Discharge date: 06/12/2020  Primary Care Provider: Patient, No Pcp Per  Primary Cardiologist: Buford Dresser, MD  Discharge Diagnoses    Active Problems:   Atrial fibrillation with RVR ([place])   Cardiomyopathy ([place])   Acute combined systolic and diastolic heart failure ([place])   Demand ischemia ([place])   Blood glucose elevated   UTI (urinary tract infection)  Diagnostic Studies/Procedures    Echo 06/10/2020 1. LVEF is severely depressed The proximal portion of LV contracts an  distal 2/3 is severely hypokinetic/akinetic . Left ventricular ejection  fraction, by estimation, is 25%%. The left ventricle has severely  decreased function. The left ventricle  demonstrates regional wall motion abnormalities (see scoring  diagram/findings for description). There is mild left ventricular  hypertrophy. Left ventricular diastolic parameters are indeterminate.  2. Proximal portion of RV contracts; mid/distal is akinetic/dyskinetic..  Right ventricular systolic function is severely reduced. The right  ventricular size is normal. There is mildly elevated pulmonary artery  systolic pressure.  3. Left atrial size was moderately dilated.  4. Right atrial size was moderately dilated.  5. The mitral valve is abnormal. Mild mitral valve regurgitation.  6. The aortic valve is abnormal. Aortic valve regurgitation is trivial.  Mild aortic valve sclerosis is present, with no evidence of aortic valve  stenosis.  7. The inferior vena cava is dilated in size with <50% respiratory  variability, suggesting right atrial pressure of 15 mmHg.    History of Present Illness     Anita Stanton is a 64 y.o. female with no significant PMH presented with 4 days hx of fatigue and shortness of breath. She attributed this to changing her cat's clay litter box, but on arrival she was found to be  in atrial fibrillation with RVR. She denied prior cardiac history.  She has had shortness of breath, nausea, diarrhea, and fatigue for several days. Her symptoms worsened and she called EMS. She reports being given adenosine x2 with only brief improvement in her heart rate. Per EMS notes, this documented that her rhythm was atrial fibrillation and not SVT or flutter.   Hospital Course     Consultants: None  1. New onset atrial fibrillation with RVR - CHA2DS2/VAS Stroke Risk Points=2. Started on Eliquis for anticoagulation. She was treated with Cardizem gtt and metoprolol. However became bradycardic which resolved with discontinuation of cardizem.  Declined TEE cardioversion. Plan medical management with possible cardioversion in future. Echo as below. Continue Toprol XL and Eliquis (free 30 days supply given from [facility]).   2. Acute combined CHF - Echo showed LVEF of 25% with LV contracts an distal 2/3 is severely hypokinetic/akinetic. There is mild left ventricular  hypertrophy. Left ventricular diastolic parameters are indeterminate. Proximal portion of RV contracts; mid/distal is akinetic/dyskinetic..  Right ventricular systolic function is severely reduced. - Declined cardiac cath for ischemic evaluation. Dr. Harrell Gave discussed with the patient multiple times extensively. Patient wished to pursue medical management and recheck echo in future.  - Continue long acting BB (patient declined to increase further dose) and ARB.  - Plan to continue Lasix until office visit and then likely change to PRN based on findings and renal function - Heart failure eduction given and written material provided on instructions  3. UTI - She had Leukocytosis, dry cough, O2 requirement, crackles on exam - UA with positive nitrite, small leukocytes, small protein/ketones, many bacteria.  - urine  culture with >100,000 colonies of e. Coli - strep pneumo, legionella negative - No fever - She will  complete Microbid course as outpatient  4. Elevated Blood Glucose  - Hgb A1c 6.0 - Treated with SSI - Outpatient follow up with PCP  5. Elevated Troponin  - Suspecedt demand ischemia in the setting of atrial fibrillation with RVR given low level and trend - Declined cath    Did the patient have an acute coronary syndrome (MI, NSTEMI, STEMI, etc) this admission?:  No.   The elevated Troponin was due to the acute medical illness (demand ischemia).  _____________  Discharge Vitals Blood pressure 133/77, pulse 87, temperature 98 F (36.7 C), temperature source Oral, resp. rate 20, height 5\' 2"  (1.575 m), weight 99.1 kg, SpO2 94 %.  Filed Weights   06/10/20 0523 06/11/20 0618 06/12/20 0608  Weight: 101 kg 100.7 kg 99.1 kg    Labs & Radiologic Studies    CBC Recent Labs    06/11/20 0510 06/12/20 0318  WBC 12.5* 10.0  HGB 12.6 13.2  HCT 41.1 42.4  MCV 89.5 88.7  PLT 346 308   Basic Metabolic Panel Recent Labs    06/14/20 0510 06/12/20 0318  NA 138 141  K 4.1 3.6  CL 104 103  CO2 24 26  GLUCOSE 99 107*  BUN 38* 37*  CREATININE 0.89 1.03*  CALCIUM 8.9 9.0   High Sensitivity Troponin:   Recent Labs  Lab 06/09/20 0611 06/09/20 0803  TROPONINIHS 53* 83*   Hemoglobin A1C Recent Labs    06/10/20 0807  HGBA1C 6.0*   Fasting Lipid Panel Recent Labs    06/10/20 0807  CHOL 180  HDL 53  LDLCALC 105*  TRIG 109  CHOLHDL 3.4   _____________  DG Chest Port 1 View  Result Date: 06/09/2020 CLINICAL DATA:  Shortness of breath EXAM: PORTABLE CHEST 1 VIEW COMPARISON:  None. FINDINGS: Lungs are clear. Heart is mildly enlarged with pulmonary vascularity normal. No adenopathy. Aorta is mildly tortuous. No bone lesions. IMPRESSION: Mild cardiac enlargement. Tortuous aorta; question chronic hypertension. No edema or airspace opacity. Electronically Signed   By: 08/09/2020 III M.D.   On: 06/09/2020 07:52   ECHOCARDIOGRAM COMPLETE  Result Date: 06/10/2020     ECHOCARDIOGRAM REPORT   Patient Name:   Anita Stanton Date of Exam: 06/10/2020 Medical Rec #:  06/12/2020           Height:       62.0 in Accession #:    [id number]          Weight:       222.7 lb Date of Birth:  1956-09-20           BSA:          2.001 m Patient Age:    64 years            BP:           129/93 mmHg Patient Gender: F                   HR:           94 bpm. Exam Location:  Inpatient Procedure: 2D Echo, Cardiac Doppler and Color Doppler Indications:    Atrial Fibrillation 427.31 / I48.91  History:        Patient has no prior history of Echocardiogram examinations.                 Arrythmias:Atrial Fibrillation.  Sonographer:    Celesta Gentile RCS Referring Phys: [id number] BRIDGETTE CHRISTOPHER IMPRESSIONS  1. LVEF is severely depressed The proximal portion of LV contracts an distal 2/3 is severely hypokinetic/akinetic . Left ventricular ejection fraction, by estimation, is 25%%. The left ventricle has severely decreased function. The left ventricle demonstrates regional wall motion abnormalities (see scoring diagram/findings for description). There is mild left ventricular hypertrophy. Left ventricular diastolic parameters are indeterminate.  2. Proximal portion of RV contracts; mid/distal is akinetic/dyskinetic.. Right ventricular systolic function is severely reduced. The right ventricular size is normal. There is mildly elevated pulmonary artery systolic pressure.  3. Left atrial size was moderately dilated.  4. Right atrial size was moderately dilated.  5. The mitral valve is abnormal. Mild mitral valve regurgitation.  6. The aortic valve is abnormal. Aortic valve regurgitation is trivial. Mild aortic valve sclerosis is present, with no evidence of aortic valve stenosis.  7. The inferior vena cava is dilated in size with <50% respiratory variability, suggesting right atrial pressure of 15 mmHg. FINDINGS  Left Ventricle: LVEF is severely depressed The proximal portion of LV contracts an distal 2/3  is severely hypokinetic/akinetic. Left ventricular ejection fraction, by estimation, is 25%%. The left ventricle has severely decreased function. The left ventricle demonstrates regional wall motion abnormalities. The left ventricular internal cavity size was normal in size. There is mild left ventricular hypertrophy. Left ventricular diastolic parameters are indeterminate. Right Ventricle: Proximal portion of RV contracts; mid/distal is akinetic/dyskinetic. The right ventricular size is normal. Right vetricular wall thickness was not assessed. Right ventricular systolic function is severely reduced. There is mildly elevated pulmonary artery systolic pressure. The tricuspid regurgitant velocity is 2.46 m/s, and with an assumed right atrial pressure of 15 mmHg, the estimated right ventricular systolic pressure is 39.2 mmHg. Left Atrium: Left atrial size was moderately dilated. Right Atrium: Right atrial size was moderately dilated. Pericardium: There is no evidence of pericardial effusion. Mitral Valve: The mitral valve is abnormal. There is mild thickening of the mitral valve leaflet(s). Mild mitral valve regurgitation. Tricuspid Valve: The tricuspid valve is normal in structure. Tricuspid valve regurgitation is mild. Aortic Valve: The aortic valve is abnormal. Aortic valve regurgitation is trivial. Aortic regurgitation PHT measures 552 msec. Mild aortic valve sclerosis is present, with no evidence of aortic valve stenosis. Pulmonic Valve: The pulmonic valve was normal in structure. Pulmonic valve regurgitation is not visualized. Aorta: The aortic root and ascending aorta are structurally normal, with no evidence of dilitation. Venous: The inferior vena cava is dilated in size with less than 50% respiratory variability, suggesting right atrial pressure of 15 mmHg. IAS/Shunts: No atrial level shunt detected by color flow Doppler.  LEFT VENTRICLE PLAX 2D LVIDd:         4.90 cm LVIDs:         4.00 cm LV PW:          1.10 cm LV IVS:        1.40 cm LVOT diam:     2.40 cm LV SV:         45 LV SV Index:   22 LVOT Area:     4.52 cm  LV Volumes (MOD) LV vol d, MOD A2C: 79.2 ml LV vol d, MOD A4C: 90.9 ml LV vol s, MOD A2C: 44.4 ml LV vol s, MOD A4C: 51.8 ml LV SV MOD A2C:     34.8 ml LV SV MOD A4C:     90.9 ml LV SV MOD BP:  37.0 ml RIGHT VENTRICLE TAPSE (M-mode): 1.5 cm LEFT ATRIUM             Index       RIGHT ATRIUM           Index LA diam:        3.40 cm 1.70 cm/m  RA Area:     19.50 cm LA Vol (A2C):   99.7 ml 49.83 ml/m RA Volume:   51.60 ml  25.79 ml/m LA Vol (A4C):   86.2 ml 43.08 ml/m LA Biplane Vol: 96.9 ml 48.43 ml/m  AORTIC VALVE LVOT Vmax:   66.60 cm/s LVOT Vmean:  45.000 cm/s LVOT VTI:    0.099 m AI PHT:      552 msec  AORTA Ao Root diam: 3.70 cm MITRAL VALVE               TRICUSPID VALVE MV Area (PHT): 5.02 cm    TR Peak grad:   24.2 mmHg MV Decel Time: 151 msec    TR Vmax:        246.00 cm/s MV E velocity: 94.00 cm/s                            SHUNTS                            Systemic VTI:  0.10 m                            Systemic Diam: 2.40 cm Dietrich Pates MD Electronically signed by Dietrich Pates MD Signature Date/Time: 06/10/2020/4:08:22 PM    Final    Disposition   Pt is being discharged home today in good condition.  Follow-up Plans & Appointments     Follow-up Information    Jodelle Gross, NP. Go on 06/27/2020.   Specialties: Nurse Practitioner, Radiology, Cardiology Why: @11 :45am for hospital follow up with Dr. PA/NP. Please arrive 15 minutes early  Contact information: [address] [place] [phone]              Discharge Instructions    Diet - low sodium heart healthy   Complete by: As directed    Increase activity slowly   Complete by: As directed       Discharge Medications   Allergies as of 06/12/2020   No Known Allergies     Medication List    TAKE these medications   apixaban 5 MG Tabs tablet Commonly known  as: ELIQUIS Take 1 tablet (5 mg total) by mouth 2 (two) times daily.   furosemide 40 MG tablet Commonly known as: LASIX Take 1 tablet (40 mg total) by mouth daily.   losartan 25 MG tablet Commonly known as: COZAAR Take 0.5 tablets (12.5 mg total) by mouth daily.   Magnesium 100 MG Tabs Take 100 mg by mouth daily.   Melatonin 2.5 MG Chew Chew 1.25 mg by mouth at bedtime.   metoprolol 200 MG 24 hr tablet Commonly known as: TOPROL-XL Take 1 tablet (200 mg total) by mouth daily. Take with or immediately following a meal.   nitrofurantoin (macrocrystal-monohydrate) 100 MG capsule Commonly known as: MACROBID Take 1 capsule (100 mg total) by mouth every 12 (twelve) hours.   Probiotic Acidophilus Caps Take 1 capsule by mouth daily.   vitamin C 1000 MG tablet Take  1,000 mg by mouth daily.   VITAMIN-B COMPLEX PO Take 1 tablet by mouth daily.   Zinc 10 MG Lozg Use as directed 10 mg in the mouth or throat daily as needed (cold symptoms).          Outstanding Labs/Studies   BMET at follow up   Duration of Discharge Encounter   Greater than 30 minutes including physician time.  Lorelei Pont, PA 06/12/2020, 9:32 AM

## 2020-06-12 NOTE — Progress Notes (Signed)
Progress Note  Patient Name: Anita Stanton Date of Encounter: 06/12/2020  [facility] Cardiologist: Jodelle Red, MD  Subjective   Spent 55 minutes today reviewing medication recommendations, see below. Feels much improved. Prefers natural remedies, but we discussed minimizing medications while still aiming for heart protection. Off O2, breathing is better. No chest pain, good appetite.  Inpatient Medications    Scheduled Meds: . apixaban  5 mg Oral BID  . furosemide  40 mg Oral Daily  . insulin aspart  0-5 Units Subcutaneous QHS  . insulin aspart  0-6 Units Subcutaneous TID WC  . losartan  12.5 mg Oral Daily  . metoprolol succinate  200 mg Oral Daily  . nitrofurantoin (macrocrystal-monohydrate)  100 mg Oral Q12H   Continuous Infusions:  PRN Meds: acetaminophen   Vital Signs    Vitals:   06/11/20 2120 06/11/20 2300 06/12/20 0608 06/12/20 0651  BP: (!) 147/107 (!) 149/87 132/87 133/77  Pulse: 91 90 (!) 107 87  Resp:   (!) 22 20  Temp: 97.7 F (36.5 C) 97.9 F (36.6 C) 98 F (36.7 C) 98 F (36.7 C)  TempSrc: Oral Oral Oral Oral  SpO2: 94% 99% 91% 94%  Weight:   99.1 kg   Height:        Intake/Output Summary (Last 24 hours) at 06/12/2020 0859 Last data filed at 06/11/2020 2202 Gross per 24 hour  Intake 1060 ml  Output 1800 ml  Net -740 ml   Last 3 Weights 06/12/2020 06/11/2020 06/10/2020  Weight (lbs) 218 lb 8 oz 221 lb 14.4 oz 222 lb 10.6 oz  Weight (kg) 99.111 kg 100.653 kg 101 kg      Telemetry    Atrial fibrillation, rates 70s-100s- Personally Reviewed  ECG    06/09/20 afib RVR 158 bpm - Personally Reviewed  Physical Exam   GEN: Well nourished, well developed in no acute distress HEENT: Normal, moist mucous membranes NECK: No JVD CARDIAC: regular rhythm, normal S1 and S2, no rubs or gallops. No murmur. VASCULAR: Radial and DP pulses 2+ bilaterally. No carotid bruits RESPIRATORY:  Largely clear to auscultation. Rales much  improved, only in basilar-lateral portions. ABDOMEN: Soft, non-tender, non-distended MUSCULOSKELETAL:  Ambulates independently SKIN: Warm and dry, no edema NEUROLOGIC:  Alert and oriented x 3. No focal neuro deficits noted. PSYCHIATRIC:  Normal affect   Labs    High Sensitivity Troponin:   Recent Labs  Lab 06/09/20 0611 06/09/20 0803  TROPONINIHS 53* 83*      Chemistry Recent Labs  Lab 06/10/20 0807 06/11/20 0510 06/12/20 0318  NA 138 138 141  K 4.8 4.1 3.6  CL 108 104 103  CO2 21* 24 26  GLUCOSE 126* 99 107*  BUN 28* 38* 37*  CREATININE 1.10* 0.89 1.03*  CALCIUM 9.2 8.9 9.0  GFRNONAA 52* >60 57*  GFRAA >60 >60 >60  ANIONGAP 9 10 12      Hematology Recent Labs  Lab 06/10/20 0807 06/11/20 0510 06/12/20 0318  WBC 15.3* 12.5* 10.0  RBC 4.96 4.59 4.78  HGB 13.7 12.6 13.2  HCT 44.3 41.1 42.4  MCV 89.3 89.5 88.7  MCH 27.6 27.5 27.6  MCHC 30.9 30.7 31.1  RDW 15.5 15.4 15.2  PLT 371 346 308    BNP Recent Labs  Lab 06/09/20 0611  BNP 319.8*     DDimer No results for input(s): DDIMER in the last 168 hours.   Radiology    ECHOCARDIOGRAM COMPLETE  Result Date: 06/10/2020    ECHOCARDIOGRAM REPORT  Patient Name:   MARYROSE COLVIN GARNER Date of Exam: 06/10/2020 Medical Rec #:  [id number]           Height:       62.0 in Accession #:    [id number]          Weight:       222.7 lb Date of Birth:  1956/04/23           BSA:          2.001 m Patient Age:    21 years            BP:           129/93 mmHg Patient Gender: F                   HR:           94 bpm. Exam Location:  Inpatient Procedure: 2D Echo, Cardiac Doppler and Color Doppler Indications:    Atrial Fibrillation 427.31 / I48.91  History:        Patient has no prior history of Echocardiogram examinations.                 Arrythmias:Atrial Fibrillation.  Sonographer:    Alvino Chapel RCS Referring Phys: [id number] [place]  1. LVEF is severely depressed The proximal portion of LV contracts an  distal 2/3 is severely hypokinetic/akinetic . Left ventricular ejection fraction, by estimation, is 25%%. The left ventricle has severely decreased function. The left ventricle demonstrates regional wall motion abnormalities (see scoring diagram/findings for description). There is mild left ventricular hypertrophy. Left ventricular diastolic parameters are indeterminate.  2. Proximal portion of RV contracts; mid/distal is akinetic/dyskinetic.. Right ventricular systolic function is severely reduced. The right ventricular size is normal. There is mildly elevated pulmonary artery systolic pressure.  3. Left atrial size was moderately dilated.  4. Right atrial size was moderately dilated.  5. The mitral valve is abnormal. Mild mitral valve regurgitation.  6. The aortic valve is abnormal. Aortic valve regurgitation is trivial. Mild aortic valve sclerosis is present, with no evidence of aortic valve stenosis.  7. The inferior vena cava is dilated in size with <50% respiratory variability, suggesting right atrial pressure of 15 mmHg. FINDINGS  Left Ventricle: LVEF is severely depressed The proximal portion of LV contracts an distal 2/3 is severely hypokinetic/akinetic. Left ventricular ejection fraction, by estimation, is 25%%. The left ventricle has severely decreased function. The left ventricle demonstrates regional wall motion abnormalities. The left ventricular internal cavity size was normal in size. There is mild left ventricular hypertrophy. Left ventricular diastolic parameters are indeterminate. Right Ventricle: Proximal portion of RV contracts; mid/distal is akinetic/dyskinetic. The right ventricular size is normal. Right vetricular wall thickness was not assessed. Right ventricular systolic function is severely reduced. There is mildly elevated pulmonary artery systolic pressure. The tricuspid regurgitant velocity is 2.46 m/s, and with an assumed right atrial pressure of 15 mmHg, the estimated right  ventricular systolic pressure is 40.0 mmHg. Left Atrium: Left atrial size was moderately dilated. Right Atrium: Right atrial size was moderately dilated. Pericardium: There is no evidence of pericardial effusion. Mitral Valve: The mitral valve is abnormal. There is mild thickening of the mitral valve leaflet(s). Mild mitral valve regurgitation. Tricuspid Valve: The tricuspid valve is normal in structure. Tricuspid valve regurgitation is mild. Aortic Valve: The aortic valve is abnormal. Aortic valve regurgitation is trivial. Aortic regurgitation PHT measures 552 msec. Mild aortic valve sclerosis is present, with no evidence  of aortic valve stenosis. Pulmonic Valve: The pulmonic valve was normal in structure. Pulmonic valve regurgitation is not visualized. Aorta: The aortic root and ascending aorta are structurally normal, with no evidence of dilitation. Venous: The inferior vena cava is dilated in size with less than 50% respiratory variability, suggesting right atrial pressure of 15 mmHg. IAS/Shunts: No atrial level shunt detected by color flow Doppler.  LEFT VENTRICLE PLAX 2D LVIDd:         4.90 cm LVIDs:         4.00 cm LV PW:         1.10 cm LV IVS:        1.40 cm LVOT diam:     2.40 cm LV SV:         45 LV SV Index:   22 LVOT Area:     4.52 cm  LV Volumes (MOD) LV vol d, MOD A2C: 79.2 ml LV vol d, MOD A4C: 90.9 ml LV vol s, MOD A2C: 44.4 ml LV vol s, MOD A4C: 51.8 ml LV SV MOD A2C:     34.8 ml LV SV MOD A4C:     90.9 ml LV SV MOD BP:      37.0 ml RIGHT VENTRICLE TAPSE (M-mode): 1.5 cm LEFT ATRIUM             Index       RIGHT ATRIUM           Index LA diam:        3.40 cm 1.70 cm/m  RA Area:     19.50 cm LA Vol (A2C):   99.7 ml 49.83 ml/m RA Volume:   51.60 ml  25.79 ml/m LA Vol (A4C):   86.2 ml 43.08 ml/m LA Biplane Vol: 96.9 ml 48.43 ml/m  AORTIC VALVE LVOT Vmax:   66.60 cm/s LVOT Vmean:  45.000 cm/s LVOT VTI:    0.099 m AI PHT:      552 msec  AORTA Ao Root diam: 3.70 cm MITRAL VALVE                TRICUSPID VALVE MV Area (PHT): 5.02 cm    TR Peak grad:   24.2 mmHg MV Decel Time: 151 msec    TR Vmax:        246.00 cm/s MV E velocity: 94.00 cm/s                            SHUNTS                            Systemic VTI:  0.10 m                            Systemic Diam: 2.40 cm Dietrich Pates MD Electronically signed by Dietrich Pates MD Signature Date/Time: 06/10/2020/4:08:22 PM    Final     Cardiac Studies   Echo 06/11/20  1. LVEF is severely depressed The proximal portion of LV contracts an  distal 2/3 is severely hypokinetic/akinetic . Left ventricular ejection  fraction, by estimation, is 25%%. The left ventricle has severely  decreased function. The left ventricle  demonstrates regional wall motion abnormalities (see scoring  diagram/findings for description). There is mild left ventricular  hypertrophy. Left ventricular diastolic parameters are indeterminate.  2. Proximal portion of RV contracts; mid/distal is akinetic/dyskinetic..  Right ventricular systolic  function is severely reduced. The right  ventricular size is normal. There is mildly elevated pulmonary artery  systolic pressure.  3. Left atrial size was moderately dilated.  4. Right atrial size was moderately dilated.  5. The mitral valve is abnormal. Mild mitral valve regurgitation.  6. The aortic valve is abnormal. Aortic valve regurgitation is trivial.  Mild aortic valve sclerosis is present, with no evidence of aortic valve  stenosis.  7. The inferior vena cava is dilated in size with <50% respiratory  variability, suggesting right atrial pressure of 15 mmHg.   Patient Profile     64 y.o. female without significant PMH who presented with atrial fibrillation with RVR. Found to have new cardiomyopathy.  Assessment & Plan    Leukocytosis, dry cough, O2 requirement, crackles on exam -white count normalized this AM -strep pneumo, legionella negative -urine culture with >100,000 colonies of e. coli -UA with  positive nitrite, small leukocytes, small protein/ketones, many bacteria.  -tolerating macrobid, will complete 5 day course to end 6/18 -no fevers -nausea resolved -Covid negative -see below re: volume overload  New cardiomyopathy, acute systolic and diastolic heart failure -see prior note, declines cath at this time, wishes to pursue medical management and recheck echo -we discussed recommendations for medical management at length. Discussed entresto, SLGT2i, declines these. Discussed ARB, beta blocker. She is willing to trial these, preferably at low doses. Will start low dose losartan, but discussed that even at maximum metoprolol she is still borderline for rate control. She is amenable to keeping this dose -she still has mild lateral-basilar rales. Would continue daily lasix until seen in follow up, then can likely change to PRN -discussed heart failure education: Do the following things EVERY DAY:  1) Weigh yourself EVERY morning after you go to the bathroom but before you eat or drink anything. Write this number down in a weight log/diary. If you gain 3 pounds overnight or 5 pounds in a week, call the office.  2) Take your medicines as prescribed. If you have concerns about your medications, please call us before you stop taking them.   3) Eat low salt foods--Limit salt (sodium) to 2000 mg per day. This will help prevent your body from holding onto fluid. Read food labels as many processed foods have a lot of sodium, especially canned goods and prepackaged meats. If you would like some assistance choosing low sodium foods, we would be happy to set you up with a nutritionist.  4) Stay as active as you can everyday. Staying active will give you more energy and make your muscles stronger. Start with 5 minutes at a time and work your way up to 30 minutes a day. Break up your activities--do some in the morning and some in the afternoon. Start with 3 days per week and work your way up to 5 days  as you can.  If you have chest pain, feel short of breath, dizzy, or lightheaded, STOP. If you don't feel better after a short rest, call 911. If you do feel better, call the office to let us know you have symptoms with exercise.  5) Limit all fluids for the day to less than 2 liters. Fluid includes all drinks, coffee, juice, ice chips, soup, jello, and all other liquids.  Elevated blood glucose -endorses prior A1c normal. A1c 6.0 here -will need outpatient follow up and monitoring with PCP -glucose checks and SSI placed -consider SGLT2i or GLP1RA, not interested on discussion today  Elevated troponin: -suspect demand  ischemia in the setting of atrial fibrillation with RVR given low level and trend -echo shows severely depressed LVEF, see above. -discussed cath given reduced EF, declines at this time  Atrial fibrillation with RVR: new onset -continue apixaban -continue metoprolol succinate today, rate control improved -CHA2DS2/VAS Stroke Risk Points= 2  -discussed TEE-CV, she declines. Would like to pursue medical management and possible cardioversion alone in the future. Will reassess at follow up visit.  Will plan for discharge today.  For questions or updates, please contact [facility] Please consult [email or web address] for contact info under    Signed, Jodelle Red, MD  06/12/2020, 8:59 AM

## 2020-06-12 NOTE — Care Management Important Message (Signed)
Important Message  Patient Details  Name: Anita Stanton MRN: [id number] Date of Birth: [birth year]   Medicare Important Message Given:  Yes     Renie Ora 06/12/2020, 10:41 AM

## 2020-06-27 ENCOUNTER — Other Ambulatory Visit: Payer: Self-pay

## 2020-06-27 ENCOUNTER — Ambulatory Visit (INDEPENDENT_AMBULATORY_CARE_PROVIDER_SITE_OTHER): Payer: Medicare Other | Admitting: Adult Health

## 2020-06-27 ENCOUNTER — Encounter: Payer: Self-pay | Admitting: Adult Health

## 2020-06-27 VITALS — BP 182/140 | HR 85 | Ht 62.0 in | Wt 216.0 lb

## 2020-06-27 DIAGNOSIS — I10 Essential (primary) hypertension: Secondary | ICD-10-CM | POA: Diagnosis not present

## 2020-06-27 DIAGNOSIS — I4891 Unspecified atrial fibrillation: Secondary | ICD-10-CM | POA: Diagnosis not present

## 2020-06-27 DIAGNOSIS — I5022 Chronic systolic (congestive) heart failure: Secondary | ICD-10-CM

## 2020-06-27 NOTE — Progress Notes (Addendum)
Cardiology Office Note   Date:  06/27/2020   ID:  [name], [name] 08-Apr-1956, MRN [id number]  PCP:  Patient, No Pcp Per  Cardiologist:  Dr.Christopher  CC: Hospital follow up atrial fib   History of Present Illness: Anita Stanton is a 64 y.o. female who presents for ongoing assessment and management of atrial fibrillation.  She was seen on consultation by Dr. Cristal Deer during recent admission for atrial fibrillation with RVR with no prior cardiac history. CHADS VASC Score of 2.  Eliquis was started.  She was initially treated with Cardizem drip and metoprolol however she became bradycardic, and diltiazem was discontinued.  The patient declined TEE cardioversion.  She was continued on metoprolol and Eliquis on discharge.  Her echocardiogram revealed LVEF of 25% with LV contracts on distal 2-3 is severely hypokinetic/akinetic.  Patient was also found to have some mild left ventricular hypertrophy.  Cardiac catheterization was discussed with the patient on multiple occasions and she declined proceeding with this.  She was continued on Lasix for CHF, and ARB.  Of note she was positive for UTI and was started on Macrobid course to continue is in a patient.  She comes today with multiple questions and concerns.  She is very anxious about her heart rate.  She speaks at length about her lack of sleep, stress in her life, feeling tired and worn out.  She has been medically compliant.  She denies chest pain, bleeding issues, hemoptysis, or dizziness.  She states she tries to stay active as they have several acres that she walks on and cares for animals.  However she is not feeling better.  She was under the assumption that atrial fibrillation had resolved.  History reviewed. No pertinent past medical history.  Past Surgical History:  Procedure Laterality Date  . TONSILECTOMY, ADENOIDECTOMY, BILATERAL MYRINGOTOMY AND TUBES N/A      Current Outpatient Medications  Medication Sig Dispense  Refill  . apixaban (ELIQUIS) 5 MG TABS tablet Take 1 tablet (5 mg total) by mouth 2 (two) times daily. 60 tablet 11  . Ascorbic Acid (VITAMIN C) 1000 MG tablet Take 1,000 mg by mouth daily.    . B Complex Vitamins (VITAMIN-B COMPLEX PO) Take 1 tablet by mouth daily.    . furosemide (LASIX) 40 MG tablet Take 1 tablet (40 mg total) by mouth daily. 30 tablet 1  . Lactobacillus (PROBIOTIC ACIDOPHILUS) CAPS Take 1 capsule by mouth daily.    Marland Kitchen losartan (COZAAR) 25 MG tablet Take 0.5 tablets (12.5 mg total) by mouth daily. 30 tablet 3  . Magnesium 100 MG TABS Take 100 mg by mouth daily.    . Melatonin 2.5 MG CHEW Chew 1.25 mg by mouth at bedtime.    . metoprolol succinate (TOPROL-XL) 200 MG 24 hr tablet Take 1 tablet (200 mg total) by mouth daily. Take with or immediately following a meal. 30 tablet 6  . nitrofurantoin, macrocrystal-monohydrate, (MACROBID) 100 MG capsule Take 1 capsule (100 mg total) by mouth every 12 (twelve) hours. 7 capsule 0  . Zinc 10 MG LOZG Use as directed 10 mg in the mouth or throat daily as needed (cold symptoms).     No current facility-administered medications for this visit.    Allergies:   Patient has no known allergies.    Social History:  The patient  reports that she has never smoked. She has never used smokeless tobacco. She reports previous alcohol use. She reports that she does not use drugs.  Family History:  The patient's family history includes Atrial fibrillation in her mother; Cancer in her mother.    ROS: All other systems are reviewed and negative. Unless otherwise mentioned in H&P    PHYSICAL EXAM: VS:  BP (!) 182/140   Pulse 85   Ht 5' 2" (1.575 m)   Wt 216 lb (98 kg)   SpO2 92%   BMI 39.51 kg/m  , BMI Body mass index is 39.51 kg/m. GEN: Well nourished, well developed, in no acute distress, obese.  HEENT: normal Neck: no JVD, carotid bruits, or masses Cardiac: IRRR; no murmurs, rubs, or gallops,no edema  Respiratory:  Clear to  auscultation bilaterally, normal work of breathing GI: soft, nontender, nondistended, + BS MS: no deformity or atrophy Skin: warm and dry, no rash Neuro:  Strength and sensation are intact Psych: euthymic mood, full affect   EKG: Atrial fibrillation rate of 86 bpm. ST&T wave abnormality inferior laterally and anteriorly. (Personally reviewed).   Recent Labs: 06/09/2020: B Natriuretic Peptide 319.8 06/12/2020: BUN 37; Creatinine, Ser 1.03; Hemoglobin 13.2; Platelets 308; Potassium 3.6; Sodium 141    Lipid Panel    Component Value Date/Time   CHOL 180 06/10/2020 0807   TRIG 109 06/10/2020 0807   HDL 53 06/10/2020 0807   CHOLHDL 3.4 06/10/2020 0807   VLDL 22 06/10/2020 0807   LDLCALC 105 (H) 06/10/2020 0807      Wt Readings from Last 3 Encounters:  06/27/20 216 lb (98 kg)  06/12/20 218 lb 8 oz (99.1 kg)      Other studies Reviewed: Echo 06/10/2020 1. LVEF is severely depressed The proximal portion of LV contracts an  distal 2/3 is severely hypokinetic/akinetic . Left ventricular ejection  fraction, by estimation, is 25%%. The left ventricle has severely  decreased function. The left ventricle  demonstrates regional wall motion abnormalities (see scoring  diagram/findings for description). There is mild left ventricular  hypertrophy. Left ventricular diastolic parameters are indeterminate.  2. Proximal portion of RV contracts; mid/distal is akinetic/dyskinetic..  Right ventricular systolic function is severely reduced. The right  ventricular size is normal. There is mildly elevated pulmonary artery  systolic pressure.  3. Left atrial size was moderately dilated.  4. Right atrial size was moderately dilated.  5. The mitral valve is abnormal. Mild mitral valve regurgitation.  6. The aortic valve is abnormal. Aortic valve regurgitation is trivial.  Mild aortic valve sclerosis is present, with no evidence of aortic valve  stenosis.  7. The inferior vena cava is dilated  in size with <50% respiratory  variability, suggesting right atrial pressure of 15 mmHg.    ASSESSMENT AND PLAN:  1.  Persistent atrial fibrillation: Currently rate controlled on metoprolol succinate 200 mg daily, with anticoagulation therapy using apixaban 5 mg twice daily.  She continues to have significant fatigue associated with atrial fibrillation.  I have again talked with her about cardioversion and she continues to be very resistant to this.  I have talked to her about the possibility of OSA which can precipitate this especially with obesity and not sleeping well.  She does not wish to move forward with any sleep study at this time.  She wishes to participate in lifestyle activities, increase her exercise, and also see holistic medicine physician.  I have answered several questions about atrial fibrillation, the need to keep heart rate control and avoid CVA by taking anticoagulation therapy.  She would like to think more about having a cardioversion.  I have been answered multiple   questions about this as well.  She will call us if she changes her mind or wishes to be seen sooner.  She will need to come back in so that we can go over cardioversion and plan this procedure.  She wants to talk with her husband more about it.  2.  Chronic systolic CHF: She does not have evidence of volume overload on examination.  Most recent ejection fraction was 25% with severely hypokinetic/akinetic LV.  She will continue on furosemide.  I did discuss the possibility of proceeding with cardiac catheterization, she is resistant to having this completed as well.  I have explained to her that reduced EF may be related to ischemia versus N ICM.  She does not want to discuss catheterization currently.  I have recommended that she follow with Dr. Di Kindle thoughts on moving forward with cardiac catheterization for definitive evaluation of coronary artery disease causing her reduced EF.  3.  Hypertension: Blood  pressures currently well controlled.  Continue losartan 25 mg metoprolol and Lasix.  She was concerned that her blood pressure fluctuated.  I explained to her that with atrial fibrillation it may be difficult to get an accurate blood pressure.  She states she has a wrist blood pressure cuff.  I have advised her not to use this as they are not entirely accurate especially in the setting of atrial fibrillation.  She verbalizes understanding.  Current medicines are reviewed at length with the patient today.  I have spent 45 minutes dedicated to the care of this patient on the date of this encounter to include pre-visit review of records, assessment, management and diagnostic testing,with shared decision making.  Labs/ tests ordered today include: None   Addendum: 07/03/2020 Spoke with patient by phone and she called our office on 06/29/2020 and was willing to proceed with DCCV.  I called her today to discuss the procedure, risks and benefits, and obtained her informed consent over the phone.  Of advised her to take Eliquis each day without fail.  Max Fickle, CMA called her back after I spoke with her to schedule date and time and give her additional instructions and need for Covid test prior to the procedure.  She is scheduled for Monday, July 12th, 2021 with Dr. Dietrich Pates at 11 AM.  Bettey Mare. Liborio Nixon, ANP, AACC   06/27/2020 1:08 PM    [facility] [address] Office [phone] Fax [phone]  Notice: This dictation was prepared with Dragon dictation along with smaller phrase technology. Any transcriptional errors that result from this process are unintentional and may not be corrected upon review.

## 2020-06-27 NOTE — Patient Instructions (Signed)
Medication Instructions:  Continue current medications  *If you need a refill on your cardiac medications before your next appointment, please call your pharmacy*   Lab Work: None Ordered   Testing/Procedures: None ordered   Follow-Up: At [facility], you and your health needs are our priority.  As part of our continuing mission to provide you with exceptional heart care, we have created designated Provider Care Teams.  These Care Teams include your primary Cardiologist (physician) and Advanced Practice Providers (APPs -  Physician Assistants and Nurse Practitioners) who all work together to provide you with the care you need, when you need it.  We recommend signing up for the patient portal called "MyChart".  Sign up information is provided on this After Visit Summary.  MyChart is used to connect with patients for Virtual Visits (Telemedicine).  Patients are able to view lab/test results, encounter notes, upcoming appointments, etc.  Non-urgent messages can be sent to your provider as well.   To learn more about what you can do with MyChart, go to [email or web address].    Your next appointment:   3 month(s)  The format for your next appointment:   In Person  Provider:   You may see Jodelle Red, MD or one of the following Advanced Practice Providers on your designated Care Team:    Theodore Demark, PA-C  Joni Reining, DNP, ANP  Cadence Fransico Michael, NP

## 2020-06-27 NOTE — H&P (View-Only) (Signed)
Cardiology Office Note   Date:  06/27/2020   ID:  Anita Stanton, Anita Stanton 08-Apr-1956, MRN [id number]  PCP:  Anita Stanton, No Pcp Per  Cardiologist:  Dr.Christopher  CC: Hospital follow up atrial fib   History of Present Illness: Anita Stanton is a 64 y.o. female who presents for ongoing assessment and management of atrial fibrillation.  She was seen on consultation by Dr. Cristal Deer during recent admission for atrial fibrillation with RVR with no prior cardiac history. CHADS VASC Score of 2.  Eliquis was started.  She was initially treated with Cardizem drip and metoprolol however she became bradycardic, and diltiazem was discontinued.  The Anita Stanton declined TEE cardioversion.  She was continued on metoprolol and Eliquis on discharge.  Her echocardiogram revealed LVEF of 25% with LV contracts on distal 2-3 is severely hypokinetic/akinetic.  Anita Stanton was also found to have some mild left ventricular hypertrophy.  Cardiac catheterization was discussed with the Anita Stanton on multiple occasions and she declined proceeding with this.  She was continued on Lasix for CHF, and ARB.  Of note she was positive for UTI and was started on Macrobid course to continue is in a Anita Stanton.  She comes today with multiple questions and concerns.  She is very anxious about her heart rate.  She speaks at length about her lack of sleep, stress in her life, feeling tired and worn out.  She has been medically compliant.  She denies chest pain, bleeding issues, hemoptysis, or dizziness.  She states she tries to stay active as they have several acres that she walks on and cares for animals.  However she is not feeling better.  She was under the assumption that atrial fibrillation had resolved.  History reviewed. No pertinent past medical history.  Past Surgical History:  Procedure Laterality Date  . TONSILECTOMY, ADENOIDECTOMY, BILATERAL MYRINGOTOMY AND TUBES N/A      Current Outpatient Medications  Medication Sig Dispense  Refill  . apixaban (ELIQUIS) 5 MG TABS tablet Take 1 tablet (5 mg total) by mouth 2 (two) times daily. 60 tablet 11  . Ascorbic Acid (VITAMIN C) 1000 MG tablet Take 1,000 mg by mouth daily.    . B Complex Vitamins (VITAMIN-B COMPLEX PO) Take 1 tablet by mouth daily.    . furosemide (LASIX) 40 MG tablet Take 1 tablet (40 mg total) by mouth daily. 30 tablet 1  . Lactobacillus (PROBIOTIC ACIDOPHILUS) CAPS Take 1 capsule by mouth daily.    Marland Kitchen losartan (COZAAR) 25 MG tablet Take 0.5 tablets (12.5 mg total) by mouth daily. 30 tablet 3  . Magnesium 100 MG TABS Take 100 mg by mouth daily.    . Melatonin 2.5 MG CHEW Chew 1.25 mg by mouth at bedtime.    . metoprolol succinate (TOPROL-XL) 200 MG 24 hr tablet Take 1 tablet (200 mg total) by mouth daily. Take with or immediately following a meal. 30 tablet 6  . nitrofurantoin, macrocrystal-monohydrate, (MACROBID) 100 MG capsule Take 1 capsule (100 mg total) by mouth every 12 (twelve) hours. 7 capsule 0  . Zinc 10 MG LOZG Use as directed 10 mg in the mouth or throat daily as needed (cold symptoms).     No current facility-administered medications for this visit.    Allergies:   Anita Stanton has no known allergies.    Social History:  The Anita Stanton  reports that she has never smoked. She has never used smokeless tobacco. She reports previous alcohol use. She reports that she does not use drugs.  Family History:  The Anita Stanton's family history includes Atrial fibrillation in her mother; Cancer in her mother.    ROS: All other systems are reviewed and negative. Unless otherwise mentioned in H&P    PHYSICAL EXAM: VS:  BP (!) 182/140   Pulse 85   Ht 5\' 2"  (1.575 m)   Wt 216 lb (98 kg)   SpO2 92%   BMI 39.51 kg/m  , BMI Body mass index is 39.51 kg/m. GEN: Well nourished, well developed, in no acute distress, obese.  HEENT: normal Neck: no JVD, carotid bruits, or masses Cardiac: IRRR; no murmurs, rubs, or gallops,no edema  Respiratory:  Clear to  auscultation bilaterally, normal work of breathing GI: soft, nontender, nondistended, + BS MS: no deformity or atrophy Skin: warm and dry, no rash Neuro:  Strength and sensation are intact Psych: euthymic mood, full affect   EKG: Atrial fibrillation rate of 86 bpm. ST&T wave abnormality inferior laterally and anteriorly. (Personally reviewed).   Recent Labs: 06/09/2020: B Natriuretic Peptide 319.8 06/12/2020: BUN 37; Creatinine, Ser 1.03; Hemoglobin 13.2; Platelets 308; Potassium 3.6; Sodium 141    Lipid Panel    Component Value Date/Time   CHOL 180 06/10/2020 0807   TRIG 109 06/10/2020 0807   HDL 53 06/10/2020 0807   CHOLHDL 3.4 06/10/2020 0807   VLDL 22 06/10/2020 0807   LDLCALC 105 (H) 06/10/2020 0807      Wt Readings from Last 3 Encounters:  06/27/20 216 lb (98 kg)  06/12/20 218 lb 8 oz (99.1 kg)      Other studies Reviewed: Echo 06/10/2020 1. LVEF is severely depressed The proximal portion of LV contracts an  distal 2/3 is severely hypokinetic/akinetic . Left ventricular ejection  fraction, by estimation, is 25%%. The left ventricle has severely  decreased function. The left ventricle  demonstrates regional wall motion abnormalities (see scoring  diagram/findings for description). There is mild left ventricular  hypertrophy. Left ventricular diastolic parameters are indeterminate.  2. Proximal portion of RV contracts; mid/distal is akinetic/dyskinetic..  Right ventricular systolic function is severely reduced. The right  ventricular size is normal. There is mildly elevated pulmonary artery  systolic pressure.  3. Left atrial size was moderately dilated.  4. Right atrial size was moderately dilated.  5. The mitral valve is abnormal. Mild mitral valve regurgitation.  6. The aortic valve is abnormal. Aortic valve regurgitation is trivial.  Mild aortic valve sclerosis is present, with no evidence of aortic valve  stenosis.  7. The inferior vena cava is dilated  in size with <50% respiratory  variability, suggesting right atrial pressure of 15 mmHg.    ASSESSMENT AND PLAN:  1.  Persistent atrial fibrillation: Currently rate controlled on metoprolol succinate 200 mg daily, with anticoagulation therapy using apixaban 5 mg twice daily.  She continues to have significant fatigue associated with atrial fibrillation.  I have again talked with her about cardioversion and she continues to be very resistant to this.  I have talked to her about the possibility of OSA which can precipitate this especially with obesity and not sleeping well.  She does not wish to move forward with any sleep study at this time.  She wishes to participate in lifestyle activities, increase her exercise, and also see holistic medicine physician.  I have answered several questions about atrial fibrillation, the need to keep heart rate control and avoid CVA by taking anticoagulation therapy.  She would like to think more about having a cardioversion.  I have been answered multiple  questions about this as well.  She will call us if she changes her mind or wishes to be seen sooner.  She will need to come back in so that we can go over cardioversion and plan this procedure.  She wants to talk with her husband more about it.  2.  Chronic systolic CHF: She does not have evidence of volume overload on examination.  Most recent ejection fraction was 25% with severely hypokinetic/akinetic LV.  She will continue on furosemide.  I did discuss the possibility of proceeding with cardiac catheterization, she is resistant to having this completed as well.  I have explained to her that reduced EF may be related to ischemia versus N ICM.  She does not want to discuss catheterization currently.  I have recommended that she follow with Dr. Di Kindle thoughts on moving forward with cardiac catheterization for definitive evaluation of coronary artery disease causing her reduced EF.  3.  Hypertension: Blood  pressures currently well controlled.  Continue losartan 25 mg metoprolol and Lasix.  She was concerned that her blood pressure fluctuated.  I explained to her that with atrial fibrillation it may be difficult to get an accurate blood pressure.  She states she has a wrist blood pressure cuff.  I have advised her not to use this as they are not entirely accurate especially in the setting of atrial fibrillation.  She verbalizes understanding.  Current medicines are reviewed at length with the Anita Stanton today.  I have spent 45 minutes dedicated to the care of this Anita Stanton on the date of this encounter to include pre-visit review of records, assessment, management and diagnostic testing,with shared decision making.  Labs/ tests ordered today include: None   Addendum: 07/03/2020 Spoke with Anita Stanton by phone and she called our office on 06/29/2020 and was willing to proceed with DCCV.  I called her today to discuss the procedure, risks and benefits, and obtained her informed consent over the phone.  Of advised her to take Eliquis each day without fail.  Max Fickle, CMA called her back after I spoke with her to schedule date and time and give her additional instructions and need for Covid test prior to the procedure.  She is scheduled for Monday, July 12th, 2021 with Dr. Dietrich Pates at 11 AM.  Bettey Mare. Liborio Nixon, ANP, AACC   06/27/2020 1:08 PM    [facility] [address] Office [phone] Fax [phone]  Notice: This dictation was prepared with Dragon dictation along with smaller phrase technology. Any transcriptional errors that result from this process are unintentional and may not be corrected upon review.

## 2020-06-28 ENCOUNTER — Telehealth: Payer: Self-pay | Admitting: Adult Health

## 2020-06-28 DIAGNOSIS — Z01812 Encounter for preprocedural laboratory examination: Secondary | ICD-10-CM

## 2020-06-28 DIAGNOSIS — Z79899 Other long term (current) drug therapy: Secondary | ICD-10-CM

## 2020-06-28 NOTE — Telephone Encounter (Signed)
° °  Pt said she is ready to schedule her procedure

## 2020-06-28 NOTE — Telephone Encounter (Signed)
Will forward to Nya CMA with Harriet Pho DNP

## 2020-06-29 NOTE — Telephone Encounter (Signed)
Anita Stanton is calling back to follow up on the status of scheduling her procedure. Please advise.

## 2020-06-29 NOTE — Telephone Encounter (Signed)
Spoke with the patient. She stated that she wants to move forward with a cardioversion. She would prefer not to have it on a Friday.

## 2020-07-01 NOTE — Telephone Encounter (Signed)
Thank you. We will need to call her to talk about the procedure so that she has an informed consent.  Will see if I can call her on Tuesday, 07/03/2020. She is not to miss a dose of her anticoagulant.   Anita Stanton

## 2020-07-03 ENCOUNTER — Other Ambulatory Visit: Payer: Self-pay | Admitting: Adult Health

## 2020-07-03 NOTE — Telephone Encounter (Signed)
Dear [name]  You are scheduled for a Cardioversion on Monday July 12 th with Dr. Tenny Craw.  Please arrive at the [facility] (Main Entrance A) at [facility]: [address] [place], [place] [address] at 10 am. (1 hour prior to procedure unless lab work is needed; if lab work is needed arrive 1.5 hours ahead)  DIET: Nothing to eat or drink after midnight except a sip of water with medications (see medication instructions below)  Medication Instructions: Hold Furosemide(Lasix) day of procedure  Continue your anticoagulant: Eliquis You will need to continue your anticoagulant after your procedure until you  are told by your  Provider that it is safe to stop   Come to: [address] between the hours of 8:00 am and 4:30 pm. You do not have to be fasting.   You must have a responsible person to drive you home and stay in the waiting area during your procedure. Failure to do so could result in cancellation.  Bring your insurance cards.  *Special Note: Every effort is made to have your procedure done on time. Occasionally there are emergencies that occur at the hospital that may cause delays. Please be patient if a delay does occur.

## 2020-07-06 ENCOUNTER — Other Ambulatory Visit (HOSPITAL_COMMUNITY)
Admission: RE | Admit: 2020-07-06 | Discharge: 2020-07-06 | Disposition: A | Discharge status: Home / Self Care | Payer: Medicare Other | Source: Ambulatory Visit | Attending: Internal Medicine | Admitting: Internal Medicine

## 2020-07-06 DIAGNOSIS — Z01812 Encounter for preprocedural laboratory examination: Secondary | ICD-10-CM | POA: Insufficient documentation

## 2020-07-06 DIAGNOSIS — Z20822 Contact with and (suspected) exposure to covid-19: Secondary | ICD-10-CM | POA: Insufficient documentation

## 2020-07-06 LAB — SARS CORONAVIRUS 2 (TAT 6-24 HRS): SARS Coronavirus 2: NEGATIVE

## 2020-07-07 LAB — BASIC METABOLIC PANEL
BUN/Creatinine Ratio: 27 (ref 12–28)
BUN: 25 mg/dL (ref 8–27)
CO2: 24 mmol/L (ref 20–29)
Calcium: 9.3 mg/dL (ref 8.7–10.3)
Chloride: 103 mmol/L (ref 96–106)
Creatinine, Ser: 0.91 mg/dL (ref 0.57–1.00)
GFR calc Af Amer: 76 mL/min/{1.73_m2} (ref >59)
GFR calc non Af Amer: 66 mL/min/{1.73_m2} (ref >59)
Glucose: 101 mg/dL — ABNORMAL HIGH (ref 65–99)
Potassium: 5.1 mmol/L (ref 3.5–5.2)
Sodium: 140 mmol/L (ref 134–144)

## 2020-07-07 LAB — CBC
Hematocrit: 40.6 % (ref 34.0–46.6)
Hemoglobin: 13 g/dL (ref 11.1–15.9)
MCH: 27.8 pg (ref 26.6–33.0)
MCHC: 32 g/dL (ref 31.5–35.7)
MCV: 87 fL (ref 79–97)
Platelets: 271 10*3/uL (ref 150–450)
RBC: 4.67 x10E6/uL (ref 3.77–5.28)
RDW: 15 % (ref 11.7–15.4)
WBC: 8.7 10*3/uL (ref 3.4–10.8)

## 2020-07-09 ENCOUNTER — Encounter (HOSPITAL_COMMUNITY): Payer: Self-pay | Admitting: Internal Medicine

## 2020-07-09 ENCOUNTER — Ambulatory Visit (HOSPITAL_COMMUNITY)
Admission: RE | Admit: 2020-07-09 | Discharge: 2020-07-09 | Disposition: A | Discharge status: Home / Self Care | Payer: Medicare Other | Attending: Internal Medicine | Admitting: Internal Medicine

## 2020-07-09 ENCOUNTER — Other Ambulatory Visit: Payer: Self-pay

## 2020-07-09 ENCOUNTER — Encounter (HOSPITAL_COMMUNITY)
Admission: RE | Disposition: A | Discharge status: Home / Self Care | Payer: Self-pay | Source: Home / Self Care | Attending: Internal Medicine

## 2020-07-09 ENCOUNTER — Ambulatory Visit (HOSPITAL_COMMUNITY): Payer: Medicare Other | Admitting: Certified Registered"

## 2020-07-09 DIAGNOSIS — Z79899 Other long term (current) drug therapy: Secondary | ICD-10-CM | POA: Diagnosis not present

## 2020-07-09 DIAGNOSIS — I4891 Unspecified atrial fibrillation: Secondary | ICD-10-CM

## 2020-07-09 DIAGNOSIS — I110 Hypertensive heart disease with heart failure: Secondary | ICD-10-CM | POA: Diagnosis not present

## 2020-07-09 DIAGNOSIS — I5022 Chronic systolic (congestive) heart failure: Secondary | ICD-10-CM | POA: Insufficient documentation

## 2020-07-09 DIAGNOSIS — I4819 Other persistent atrial fibrillation: Secondary | ICD-10-CM | POA: Insufficient documentation

## 2020-07-09 DIAGNOSIS — Z7901 Long term (current) use of anticoagulants: Secondary | ICD-10-CM | POA: Insufficient documentation

## 2020-07-09 DIAGNOSIS — R5383 Other fatigue: Secondary | ICD-10-CM | POA: Insufficient documentation

## 2020-07-09 HISTORY — PX: CARDIOVERSION: SHX1299

## 2020-07-09 SURGERY — CARDIOVERSION
Anesthesia: General

## 2020-07-09 MED ORDER — LIDOCAINE 2% (20 MG/ML) 5 ML SYRINGE
INTRAMUSCULAR | Status: DC | PRN
  Administered 2020-07-09: 100 mg via INTRAVENOUS

## 2020-07-09 MED ORDER — PROPOFOL 10 MG/ML IV BOLUS
INTRAVENOUS | Status: DC | PRN
  Administered 2020-07-09: 80 mg via INTRAVENOUS

## 2020-07-09 MED ORDER — SODIUM CHLORIDE 0.9 % IV SOLN: INTRAVENOUS | Status: DC | PRN

## 2020-07-09 NOTE — Discharge Instructions (Signed)
Electrical Cardioversion Electrical cardioversion is the delivery of a jolt of electricity to restore a normal rhythm to the heart. A rhythm that is too fast or is not regular keeps the heart from pumping well. In this procedure, sticky patches or metal paddles are placed on the chest to deliver electricity to the heart from a device.  Follow these instructions at home:  Do not drive for 24 hours if you were given a sedative during your procedure.  You may have some redness on the skin where the shocks were given.  Take over-the-counter and prescription medicines only as told by your health care provider.  Ask your health care provider how to check your pulse. Check it often.  Rest for 48 hours after the procedure or as told by your health care provider.  Avoid or limit your caffeine use as told by your health care provider.  Keep all follow-up visits as told by your health care provider. This is important. Contact a health care provider if:  You feel like your heart is beating too quickly or your pulse is not regular.  You have a serious muscle cramp that does not go away. Get help right away if:  You have discomfort in your chest.  You are dizzy or you feel faint.  You have trouble breathing or you are short of breath.  Your speech is slurred.  You have trouble moving an arm or leg on one side of your body.  Your fingers or toes turn cold or blue. Summary  Electrical cardioversion is the delivery of a jolt of electricity to restore a normal rhythm to the heart.  This procedure may be done right away in an emergency or may be a scheduled procedure if the condition is not an emergency.  Generally, this is a safe procedure.  After the procedure, check your pulse often as told by your health care provider. This information is not intended to replace advice given to you by your health care provider. Make sure you discuss any questions you have with your health care  provider. Document Revised: 07/18/2019 Document Reviewed: 07/18/2019 Elsevier Patient Education  2020 [employer].

## 2020-07-09 NOTE — Interval H&P Note (Signed)
History and Physical Interval Note:  07/09/2020 9:59 AM  [name]  has presented today for surgery, with the diagnosis of AFIB.  The various methods of treatment have been discussed with the patient and family. After consideration of risks, benefits and other options for treatment, the patient has consented to  Procedure(s): CARDIOVERSION (N/A) as a surgical intervention.  The patient's history has been reviewed, patient examined, no change in status, stable for surgery.  I have reviewed the patient's chart and labs.  Questions were answered to the patient's satisfaction.     Dietrich Pates

## 2020-07-09 NOTE — Transfer of Care (Signed)
Immediate Anesthesia Transfer of Care Note  Patient: Anita Stanton  Procedure(s) Performed: CARDIOVERSION (N/A )  Patient Location: Endoscopy Unit  Anesthesia Type:General  Level of Consciousness: drowsy  Airway & Oxygen Therapy: Patient Spontanous Breathing  Post-op Assessment: Report given to RN and Post -op Vital signs reviewed and stable  Post vital signs: Reviewed and stable  Last Vitals:  Vitals Value Taken Time  BP    Temp    Pulse    Resp    SpO2      Last Pain:  Vitals:   07/09/20 1023  TempSrc: Temporal  PainSc: 0-No pain         Complications: No complications documented.

## 2020-07-09 NOTE — Anesthesia Preprocedure Evaluation (Signed)
Anesthesia Evaluation  Patient identified by MRN, date of birth, ID band Patient awake    Reviewed: Allergy & Precautions, NPO status , Patient's Chart, lab work & pertinent test results  Airway Mallampati: I  TM Distance: >3 FB Neck ROM: Full    Dental   Pulmonary    Pulmonary exam normal        Cardiovascular Normal cardiovascular exam     Neuro/Psych    GI/Hepatic   Endo/Other    Renal/GU      Musculoskeletal   Abdominal   Peds  Hematology   Anesthesia Other Findings   Reproductive/Obstetrics                             Anesthesia Physical Anesthesia Plan  ASA: III  Anesthesia Plan: General   Post-op Pain Management:    Induction:   PONV Risk Score and Plan: Treatment may vary due to age or medical condition  Airway Management Planned: Mask  Additional Equipment:   Intra-op Plan:   Post-operative Plan:   Informed Consent: I have reviewed the patients History and Physical, chart, labs and discussed the procedure including the risks, benefits and alternatives for the proposed anesthesia with the patient or authorized representative who has indicated his/her understanding and acceptance.       Plan Discussed with: Surgeon and CRNA  Anesthesia Plan Comments:         Anesthesia Quick Evaluation

## 2020-07-09 NOTE — Anesthesia Procedure Notes (Signed)
Procedure Name: MAC Date/Time: 07/09/2020 10:50 AM Performed by: Imagene Riches, CRNA Pre-anesthesia Checklist: Patient identified, Emergency Drugs available, Suction available, Patient being monitored and Timeout performed Oxygen Delivery Method: Ambu bag Preoxygenation: Pre-oxygenation with 100% oxygen

## 2020-07-09 NOTE — CV Procedure (Signed)
CARDIOVERSION  Pt presents for cardioverstion for atrial fibrlllation Procedure explained/ risks explained.  Pt understands and agrees to proceed  Pt anesthetized with Propofol and Lidocaine by anesthesia  With pads in the AP position, patient cardioverted to SR with 200 J synchronized biphasic energy  Procedure was without comlication  12 lead EKG pending    Dietrich Pates MD

## 2020-07-09 NOTE — Anesthesia Postprocedure Evaluation (Signed)
Anesthesia Post Note  Patient: Anita Stanton  Procedure(s) Performed: CARDIOVERSION (N/A )     Patient location during evaluation: PACU Anesthesia Type: General Level of consciousness: awake and alert Pain management: pain level controlled Vital Signs Assessment: post-procedure vital signs reviewed and stable Respiratory status: spontaneous breathing, nonlabored ventilation, respiratory function stable and patient connected to nasal cannula oxygen Cardiovascular status: blood pressure returned to baseline and stable Postop Assessment: no apparent nausea or vomiting Anesthetic complications: no   No complications documented.  Last Vitals:  Vitals:   07/09/20 1111 07/09/20 1120  BP: 105/65 121/71  Pulse: (!) 50 (!) 51  Resp: 18 15  Temp: (!) 36.3 C   SpO2: 92% 97%    Last Pain:  Vitals:   07/09/20 1120  TempSrc:   PainSc: 0-No pain                 Ladarrius Bogdanski DAVID

## 2020-07-10 ENCOUNTER — Encounter (HOSPITAL_COMMUNITY): Payer: Self-pay | Admitting: Internal Medicine

## 2020-07-13 ENCOUNTER — Telehealth: Payer: Self-pay | Admitting: Adult Health

## 2020-07-13 NOTE — Telephone Encounter (Signed)
Patient calling stating she is sending in a mychart message and would like to know when it is received.

## 2020-07-13 NOTE — Telephone Encounter (Signed)
Spoke to patient she stated she had a cardioversion this past Monday.Stated she did not feel good for about 2 days.Stated she is feeling good today.She checked heart beat with her Anita Stanton monitor it revealed afib.Heart rate when she got up this morning 112.She took medications waited 1 hour pulse at present 80.Appointment scheduled with Edd Fabian NP 07/25/20 at 8:45 am.[place] sent to Dr.Christopher for review.

## 2020-07-16 NOTE — Telephone Encounter (Signed)
Replied to [employer], agree with plan

## 2020-07-24 NOTE — Progress Notes (Signed)
Cardiology Clinic Note   Patient Name: Anita Stanton Date of Encounter: 07/25/2020  Primary Care Provider:  Patient, No Pcp Per Primary Cardiologist:  Jodelle Red, MD  Patient Profile    Anita Stanton 64 year old female presents to the clinic today for follow-up status post DCCV.  Past Medical History    History reviewed. No pertinent past medical history. Past Surgical History:  Procedure Laterality Date  . CARDIOVERSION N/A 07/09/2020   Procedure: CARDIOVERSION;  Surgeon: Pricilla Riffle, MD;  Location: [facility] ENDOSCOPY;  Service: Cardiovascular;  Laterality: N/A;  . TONSILECTOMY, ADENOIDECTOMY, BILATERAL MYRINGOTOMY AND TUBES N/A     Allergies  No Known Allergies  History of Present Illness    Ms. [name] has a PMH of atrial fibrillation with RVR, cardiomyopathy, acute combined systolic and diastolic heart failure, and demand ischemia.  Cardiology was consulted with her recent hospital admission for atrial fibrillation with RVR and no prior cardiac history.  Her CHA2DS2-VASc score was 2.  She was started on Eliquis at that time.  She initially received Cardizem GTT and metoprolol.  However, she became bradycardic and her diltiazem was discontinued.  She had declined TEE cardioversion.  Her echocardiogram showed an LVEF of 25% with severe hypokinesis/akinesis.  She was also noted to have mild LVH.  Cardiac catheterization was also discussed on multiple occasions.  She wished to defer the procedure.  She was also found to be positive for UTI and started on Macrobid which was continued outpatient.  She was last seen by Joni Reining, DNP on 06/27/2020.  During that time indicated that she had not sleeping well, had increased stress in her life and was feeling tired/worn out.  She had been compliant with her anticoagulation.  She denied chest pain, bleeding, and dizziness.  She continued to try to be active on her property, walking around her several acres, and caring  for animals.  She indicated that she was under the assumption that her atrial fibrillation had resolved.  She underwent successful DCCV on 07/09/2020 with synchronized cardioversion of 200 J x 1.  She contacted nurse triage line on 07/13/2020 and stated that status post cardioversion she did not feel well x2 days.  She used her Lourena Simmonds monitor to check her pulse which showed atrial fibrillation with a heart rate of 112 bpm.  She took her morning medications and reported that her pulse decreased to 80.  She presents to the clinic today for follow-up evaluation and states she feels well.  However, she does notice that she does have some shortness of breath and tires easily.  She continues to be very physically active doing house chores and taking care of her animals.  She has been monitoring her heart rate with her monitor which shows heart rates consistently in the 80s-90s.  She has been compliant with her apixaban medication and states that she only feels bad when she bends over.  She states that she does not know whether she is taking 200 mg of metoprolol or 50.  She will review this and contact the office.  We reviewed the benefits of being out of atrial fibrillation and the pathophysiology behind it.  I will send her to electrophysiology and prescribed Cardizem 60 mg daily.  She did state that she does not want to start taking the medication until she is able to review it.  I will have her follow-up with Dr. Cristal Deer in 3 months.  I will provide her with the A. fib triggers handout as  well.  Today she denies chest pain, shortness of breath, lower extremity edema, fatigue, palpitations, melena, hematuria, hemoptysis, diaphoresis, weakness, presyncope, syncope, orthopnea, and PND.   Home Medications    Prior to Admission medications   Medication Sig Start Date End Date Taking? Authorizing Provider  apixaban (ELIQUIS) 5 MG TABS tablet Take 1 tablet (5 mg total) by mouth 2 (two) times daily. 06/12/20    Bhagat, Sharrell Ku, PA  Ascorbic Acid (VITAMIN C) 1000 MG tablet Take 1,000 mg by mouth daily.    [provider]  B Complex Vitamins (VITAMIN-B COMPLEX PO) Take 1 tablet by mouth daily.    [provider]  furosemide (LASIX) 40 MG tablet Take 1 tablet (40 mg total) by mouth daily. 06/12/20   Bhagat, Sharrell Ku, PA  Lactobacillus (PROBIOTIC ACIDOPHILUS) CAPS Take 1 capsule by mouth daily.    [provider]  losartan (COZAAR) 25 MG tablet Take 0.5 tablets (12.5 mg total) by mouth daily. 06/12/20   Manson Passey, PA  Magnesium 100 MG TABS Take 100 mg by mouth daily.    [provider]  melatonin 5 MG TABS Take 2.5 mg by mouth at bedtime.     [provider]  metoprolol succinate (TOPROL-XL) 200 MG 24 hr tablet Take 1 tablet (200 mg total) by mouth daily. Take with or immediately following a meal. 06/12/20   Bhagat, Bhavinkumar, PA  Multiple Vitamins-Minerals (ZINC PO) Take 10 mg by mouth daily.    [provider]  OVER THE COUNTER MEDICATION Apply 1 application topically 2 (two) times a week. anumed cream  Vit D3 cream    [provider]    Family History    Family History  Problem Relation Age of Onset  . Atrial fibrillation Mother   . Cancer Mother    She indicated that the status of her mother is unknown.  Social History    Social History   Socioeconomic History  . Marital status: Married    Spouse name: Not on file  . Number of children: Not on file  . Years of education: Not on file  . Highest education level: Not on file  Occupational History  . Not on file  Tobacco Use  . Smoking status: Never Smoker  . Smokeless tobacco: Never Used  Substance and Sexual Activity  . Alcohol use: Not Currently    Comment: rare beer on occasion  . Drug use: Never  . Sexual activity: Not on file  Other Topics Concern  . Not on file  Social History Narrative  . Not on file   Social Determinants of Health    Financial Resource Strain:   . Difficulty of Paying Living Expenses:   Food Insecurity:   . Worried About Programme researcher, broadcasting/film/video in the Last Year:   . Barista in the Last Year:   Transportation Needs:   . Freight forwarder (Medical):   Marland Kitchen Lack of Transportation (Non-Medical):   Physical Activity:   . Days of Exercise per Week:   . Minutes of Exercise per Session:   Stress:   . Feeling of Stress :   Social Connections:   . Frequency of Communication with Friends and Family:   . Frequency of Social Gatherings with Friends and Family:   . Attends Religious Services:   . Active Member of Clubs or Organizations:   . Attends Banker Meetings:   Marland Kitchen Marital Status:   Intimate Partner Violence:   . Fear of  Current or Ex-Partner:   . Emotionally Abused:   Marland Kitchen Physically Abused:   . Sexually Abused:      Review of Systems    General:  No chills, fever, night sweats or weight changes.  Cardiovascular:  No chest pain, dyspnea on exertion, edema, orthopnea, palpitations, paroxysmal nocturnal dyspnea. Dermatological: No rash, lesions/masses Respiratory: No cough, dyspnea Urologic: No hematuria, dysuria Abdominal:   No nausea, vomiting, diarrhea, bright red blood per rectum, melena, or hematemesis Neurologic:  No visual changes, wkns, changes in mental status. All other systems reviewed and are otherwise negative except as noted above.  Physical Exam    VS:  BP (!) 148/102   Pulse 95   Wt (!) 219 lb (99.3 kg)   SpO2 93%   BMI 40.06 kg/m  , BMI Body mass index is 40.06 kg/m. GEN: Well nourished, well developed, in no acute distress. HEENT: normal. Neck: Supple, no JVD, carotid bruits, or masses. Cardiac: Irregularly irregular, no murmurs, rubs, or gallops. No clubbing, cyanosis, edema.  Radials/DP/PT 2+ and equal bilaterally.  Respiratory:  Respirations regular and unlabored, clear to auscultation bilaterally. GI: Soft, nontender, nondistended, BS + x 4. MS:  no deformity or atrophy. Skin: warm and dry, no rash. Neuro:  Strength and sensation are intact. Psych: Normal affect.  Accessory Clinical Findings    Recent Labs: 06/09/2020: B Natriuretic Peptide 319.8 07/06/2020: BUN 25; Creatinine, Ser 0.91; Hemoglobin 13.0; Platelets 271; Potassium 5.1; Sodium 140   Recent Lipid Panel    Component Value Date/Time   CHOL 180 06/10/2020 0807   TRIG 109 06/10/2020 0807   HDL 53 06/10/2020 0807   CHOLHDL 3.4 06/10/2020 0807   VLDL 22 06/10/2020 0807   LDLCALC 105 (H) 06/10/2020 0807    ECG personally reviewed by me today-atrial fibrillation with premature aberrantly conducted complexes rightward axis deviation 95 bpm- No acute changes  EKG 07/09/2020 Sinus bradycardia left atrial enlargement 51 bpm  Echocardiogram 06/10/2020 1. LVEF is severely depressed The proximal portion of LV contracts an  distal 2/3 is severely hypokinetic/akinetic . Left ventricular ejection  fraction, by estimation, is 25%%. The left ventricle has severely  decreased function. The left ventricle  demonstrates regional wall motion abnormalities (see scoring  diagram/findings for description). There is mild left ventricular  hypertrophy. Left ventricular diastolic parameters are indeterminate.  2. Proximal portion of RV contracts; mid/distal is akinetic/dyskinetic..  Right ventricular systolic function is severely reduced. The right  ventricular size is normal. There is mildly elevated pulmonary artery  systolic pressure.  3. Left atrial size was moderately dilated.  4. Right atrial size was moderately dilated.  5. The mitral valve is abnormal. Mild mitral valve regurgitation.  6. The aortic valve is abnormal. Aortic valve regurgitation is trivial.  Mild aortic valve sclerosis is present, with no evidence of aortic valve  stenosis.  7. The inferior vena cava is dilated in size with <50% respiratory  variability, suggesting right atrial pressure of 15  mmHg.  Assessment & Plan   1.  Persistent atrial fibrillation-EKG today shows atrial fibrillation with premature aberrantly conducted complexes rightward axis 95 bpm.  Underwent successful DCCV on 07/09/2020 with 200 J x 1.  Contacted cardiology clinic on 07/13/2020 indicating that she had not been feeling well x2 days.  She had checked her Lourena Simmonds which indicated that her heart rate was 112 bpm. Continue apixaban, furosemide, losartan, metoprolol, Start diltiazem 60 mg daily Avoid triggers caffeine, chocolate, EtOH etc.-handout given Heart healthy low-sodium diet-salty 6 given Maintain physical  activity as tolerated  Acute systolic heart failure-echocardiogram 06/10/2020 showed an LVEF of 25% and intermediate diastolic parameters. Continue losartan, metoprolol, furosemide Heart healthy low-sodium diet-salty 6 given Increase physical activity as tolerated  Essential hypertension-BP today 148/102.  Well-controlled at home 130s over 80s Continue losartan, metoprolol Start diltiazem Heart healthy low-sodium diet Maintain physical activity Blood pressure log-maintain and bring to next appointment  Disposition: Follow-up with EP and Dr. Cristal Deer in 3 months.  Thomasene Ripple. Eldena Dede NP-C    07/25/2020, 9:29 AM [facility] [address] Office [phone] Fax [phone]

## 2020-07-25 ENCOUNTER — Ambulatory Visit (INDEPENDENT_AMBULATORY_CARE_PROVIDER_SITE_OTHER): Payer: Medicare Other | Admitting: General Practice

## 2020-07-25 ENCOUNTER — Encounter: Payer: Self-pay | Admitting: General Practice

## 2020-07-25 ENCOUNTER — Other Ambulatory Visit: Payer: Self-pay

## 2020-07-25 VITALS — BP 148/102 | HR 95 | Wt 219.0 lb

## 2020-07-25 DIAGNOSIS — I5022 Chronic systolic (congestive) heart failure: Secondary | ICD-10-CM | POA: Diagnosis not present

## 2020-07-25 DIAGNOSIS — I4891 Unspecified atrial fibrillation: Secondary | ICD-10-CM | POA: Diagnosis not present

## 2020-07-25 MED ORDER — DILTIAZEM HCL ER 60 MG PO CP12
60.0000 mg | ORAL_CAPSULE | Freq: Two times a day (BID) | ORAL | 6 refills | Status: DC
Start: 2020-07-25 — End: 2020-08-01

## 2020-07-25 NOTE — Patient Instructions (Signed)
Medication Instructions:  The current medical regimen is effective;  continue present plan and medications as directed. Please refer to the Current Medication list given to you today. *If you need a refill on your cardiac medications before your next appointment, please call your pharmacy*  Special Instructions REFERRAL TO EP  PLEASE READ AND FOLLOW SALTY 6-ATTACHED  Please try to avoid these triggers:  Do not use any products that have nicotine or tobacco in them. These include cigarettes, e-cigarettes, and chewing tobacco. If you need help quitting, ask your doctor.  Eat heart-healthy foods. Talk with your doctor about the right eating plan for you.  Exercise regularly as told by your doctor.  Do not drink alcohol, Caffeine or chocolate.  Lose weight if you are overweight.  Do not use drugs, including cannabis   TAKE AND LOG YOU BLOOD PRESSURE DAILY-BRING WITH YOU TO FOLLOW UP APPOINTMENTS  CALL WHEN YOU GET HOME AND VERIFY METOPROLOL DOSING  Follow-Up: Your next appointment:  3 month(s)  In Person with Jodelle Red, MD -OR- JESSE CLEAVER, FNP-C  At [facility], you and your health needs are our priority.  As part of our continuing mission to provide you with exceptional heart care, we have created designated Provider Care Teams.  These Care Teams include your primary Cardiologist (physician) and Advanced Practice Providers (APPs -  Physician Assistants and Nurse Practitioners) who all work together to provide you with the care you need, when you need it.

## 2020-08-01 ENCOUNTER — Other Ambulatory Visit: Payer: Self-pay

## 2020-08-01 MED ORDER — DILTIAZEM HCL ER 60 MG PO CP12: 60.0000 mg | ORAL_CAPSULE | Freq: Two times a day (BID) | ORAL | 6 refills | Status: DC

## 2020-08-02 ENCOUNTER — Other Ambulatory Visit: Payer: Self-pay | Admitting: Physician Assistant

## 2020-08-06 ENCOUNTER — Other Ambulatory Visit: Payer: Self-pay

## 2020-08-06 ENCOUNTER — Encounter: Payer: Self-pay | Admitting: Internal Medicine

## 2020-08-06 ENCOUNTER — Ambulatory Visit (INDEPENDENT_AMBULATORY_CARE_PROVIDER_SITE_OTHER): Payer: Medicare Other | Admitting: Internal Medicine

## 2020-08-06 VITALS — BP 130/100 | HR 74 | Ht 62.0 in | Wt 216.0 lb

## 2020-08-06 DIAGNOSIS — I10 Essential (primary) hypertension: Secondary | ICD-10-CM

## 2020-08-06 DIAGNOSIS — D6869 Other thrombophilia: Secondary | ICD-10-CM

## 2020-08-06 DIAGNOSIS — I4819 Other persistent atrial fibrillation: Secondary | ICD-10-CM | POA: Diagnosis not present

## 2020-08-06 DIAGNOSIS — I5022 Chronic systolic (congestive) heart failure: Secondary | ICD-10-CM

## 2020-08-06 NOTE — Progress Notes (Signed)
Electrophysiology Office Note   Date:  08/06/2020   ID:  Anita Stanton, Anita Stanton, MRN [id number]  PCP:  Patient, No Pcp Per  Cardiologist:  Dr Cristal Deer Primary Electrophysiologist: Hillis Range, MD    CC: afib   History of Present Illness: Anita Stanton is a 64 y.o. female who presents today for electrophysiology evaluation.   She was initially diagnosed with afib 06/09/20 after presenting with AF with RVR and decompensated CHF (EF 25%).  She was started on eliquis and toprol. She was cardioverted 07/09/20.  Unfortunately, she returned to afib several days later.   She reports return of SOB and decreased exercise tolerance.  + fatigue with her afib. She is referred to EP for further evaluation. Today, she denies symptoms of palpitations, chest pain, shortness of breath, orthopnea, PND, lower extremity edema, claudication, dizziness, presyncope, syncope, bleeding, or neurologic sequela. The patient is tolerating medications without difficulties and is otherwise without complaint today.    History reviewed. No pertinent past medical history. Past Surgical History:  Procedure Laterality Date   CARDIOVERSION N/A 07/09/2020   Procedure: CARDIOVERSION;  Surgeon: Pricilla Riffle, MD;  Location: [facility] ENDOSCOPY;  Service: Cardiovascular;  Laterality: N/A;   TONSILECTOMY, ADENOIDECTOMY, BILATERAL MYRINGOTOMY AND TUBES N/A      Current Outpatient Medications  Medication Sig Dispense Refill   apixaban (ELIQUIS) 5 MG TABS tablet Take 1 tablet (5 mg total) by mouth 2 (two) times daily. 60 tablet 11   Ascorbic Acid (VITAMIN C) 1000 MG tablet Take 1,000 mg by mouth daily.     B Complex Vitamins (VITAMIN-B COMPLEX PO) Take 1 tablet by mouth daily.     diltiazem (CARDIZEM SR) 60 MG 12 hr capsule Take 1 capsule (60 mg total) by mouth 2 (two) times daily. 30 capsule 6   furosemide (LASIX) 40 MG tablet TAKE 1 TAB (40 MG TOTAL) BY MOUTH DAILY 90 tablet 3   Lactobacillus  (PROBIOTIC ACIDOPHILUS) CAPS Take 1 capsule by mouth daily.     losartan (COZAAR) 25 MG tablet Take 0.5 tablets (12.5 mg total) by mouth daily. 30 tablet 3   Magnesium 100 MG TABS Take 100 mg by mouth daily.     melatonin 5 MG TABS Take 2.5 mg by mouth at bedtime.      metoprolol succinate (TOPROL-XL) 200 MG 24 hr tablet Take 1 tablet (200 mg total) by mouth daily. Take with or immediately following a meal. 30 tablet 6   Multiple Vitamins-Minerals (ZINC PO) Take 10 mg by mouth daily.     OVER THE COUNTER MEDICATION Apply 1 application topically 2 (two) times a week. anumed cream  Vit D3 cream     No current facility-administered medications for this visit.    Allergies:   Patient has no known allergies.   Social History:  The patient  reports that she has never smoked. She has never used smokeless tobacco. She reports previous alcohol use. She reports that she does not use drugs.   Family History:  The patient's family history includes Atrial fibrillation in her mother; Cancer in her mother.    ROS:  Please see the history of present illness.   All other systems are personally reviewed and negative.    PHYSICAL EXAM: VS:  BP (!) 130/100    Pulse 74    Ht 5\' 2"  (1.575 m)    Wt 216 lb (98 kg)    SpO2 95%    BMI 39.51 kg/m  , BMI Body mass  index is 39.51 kg/m. GEN: Well nourished, well developed, in no acute distress HEENT: normal Neck: no JVD  Cardiac: iRRR Respiratory:    normal work of breathing GI: soft  MS: no deformity or atrophy Skin: warm and dry  Neuro:  Strength and sensation are intact Psych: euthymic mood, full affect   Recent Labs: 06/09/2020: B Natriuretic Peptide 319.8 07/06/2020: BUN 25; Creatinine, Ser 0.91; Hemoglobin 13.0; Platelets 271; Potassium 5.1; Sodium 140  personally reviewed   Lipid Panel     Component Value Date/Time   CHOL 180 06/10/2020 0807   TRIG 109 06/10/2020 0807   HDL 53 06/10/2020 0807   CHOLHDL 3.4 06/10/2020 0807   VLDL 22  06/10/2020 0807   LDLCALC 105 (H) 06/10/2020 0807   personally reviewed   Wt Readings from Last 3 Encounters:  08/06/20 216 lb (98 kg)  07/25/20 (!) 219 lb (99.3 kg)  07/09/20 210 lb (95.3 kg)     Other studies personally reviewed: Additional studies/ records that were reviewed today include: recent hospital records and cardiology office notes  Review of the above records today demonstrates: as above  ekg today reveals afib, V rate 74 bpm Echo 06/10/20- EF 25%, mild LVH, severe RV dysfunction, moderate biatrial enlargement, mild MR   ASSESSMENT AND PLAN:  1.  Persistent afib The patient has symptomatic, recurrent persistent atrial fibrillation. she has not tried AADs Chads2vasc score is at least 4.  she is anticoagulated with eliquis . Therapeutic strategies for afib including medicine (tikosyn/ amiodarone) and ablation were discussed in detail with the patient today. Risk, benefits, and alternatives to each approach was discussed at length.  As per AHA/ACC guidelines for persistent afib, we will proceed with medical therapy prior to ablation. She wishes to be admitted for tikosyn load. We discussed lifestyle modification at length today  2. Morbid obesity Body mass index is 39.51 kg/m. Lifestyle modification advised  3. HTN Stable No change required today  4. Chronic systolic dysfunction Likely due to afib Continue medicine optimization We will continue to pursue sinus rhythm  She declines sleep study  Risks, benefits and potential toxicities for medications prescribed and/or refilled reviewed with patient today.    Current medicines are reviewed at length with the patient today.   The patient does not have concerns regarding her medicines.  The following changes were made today:  none    Signed, Hillis Range, MD  08/06/2020 10:42 AM     [facility] [address] [place] [address] [phone] (office) [phone] (fax)

## 2020-08-06 NOTE — Patient Instructions (Addendum)
Medication Instructions:  Your physician recommends that you continue on your current medications as directed. Please refer to the Current Medication list given to you today.  *If you need a refill on your cardiac medications before your next appointment, please call your pharmacy*  Lab Work: None ordered.  If you have labs (blood work) drawn today and your tests are completely normal, you will receive your results only by: Marland Kitchen MyChart Message (if you have MyChart) OR . A paper copy in the mail If you have any lab test that is abnormal or we need to change your treatment, we will call you to review the results.  Testing/Procedures: None ordered.  Follow-Up: At [facility], you and your health needs are our priority.  As part of our continuing mission to provide you with exceptional heart care, we have created designated Provider Care Teams.  These Care Teams include your primary Cardiologist (physician) and Advanced Practice Providers (APPs -  Physician Assistants and Nurse Practitioners) who all work together to provide you with the care you need, when you need it.  We recommend signing up for the patient portal called "MyChart".  Sign up information is provided on this After Visit Summary.  MyChart is used to connect with patients for Virtual Visits (Telemedicine).  Patients are able to view lab/test results, encounter notes, upcoming appointments, etc.  Non-urgent messages can be sent to your provider as well.   To learn more about what you can do with MyChart, go to [email or web address].    Your next appointment:   Your physician wants you to follow-up in: with Afib clinic for Tikosyn loading. They will call you to schedule.    Other Instructions:  Dofetilide capsules What is this medicine? DOFETILIDE (doe FET il ide) is an antiarrhythmic drug. It helps make your heart beat regularly. This medicine also helps to slow rapid heartbeats. This medicine may be used for other  purposes; ask your health care provider or pharmacist if you have questions. COMMON BRAND NAME(S): Tikosyn What should I tell my health care provider before I take this medicine? They need to know if you have any of these conditions:  heart disease  history of irregular heartbeat  history of low levels of potassium or magnesium in the blood  kidney disease  liver disease  an unusual or allergic reaction to dofetilide, other medicines, foods, dyes, or preservatives  pregnant or trying to get pregnant  breast-feeding How should I use this medicine? Take this medicine by mouth with a glass of water. Follow the directions on the prescription label. Do not take with grapefruit juice. You can take it with or without food. If it upsets your stomach, take it with food. Take your medicine at regular intervals. Do not take it more often than directed. Do not stop taking except on your doctor's advice. A special MedGuide will be given to you by the pharmacist with each prescription and refill. Be sure to read this information carefully each time. Talk to your pediatrician regarding the use of this medicine in children. Special care may be needed. Overdosage: If you think you have taken too much of this medicine contact a poison control center or emergency room at once. NOTE: This medicine is only for you. Do not share this medicine with others. What if I miss a dose? If you miss a dose, skip it. Take your next dose at the normal time. Do not take extra or 2 doses at the same time to make up  for the missed dose. What may interact with this medicine? Do not take this medicine with any of the following medications:  cimetidine  cisapride  dolutegravir  dronedarone  hydrochlorothiazide  ketoconazole  megestrol  pimozide  prochlorperazine  thioridazine  trimethoprim  verapamil This medicine may also interact with the following medications:  amiloride  cannabinoids  certain  antibiotics like erythromycin or clarithromycin  certain antiviral medicines for HIV or hepatitis  certain medicines for depression, anxiety, or psychotic disorders  digoxin  diltiazem  grapefruit juice  metformin  nefazodone  other medicines that prolong the QT interval (an abnormal heart rhythm)  quinine  triamterene  zafirlukast  ziprasidone This list may not describe all possible interactions. Give your health care provider a list of all the medicines, herbs, non-prescription drugs, or dietary supplements you use. Also tell them if you smoke, drink alcohol, or use illegal drugs. Some items may interact with your medicine. What should I watch for while using this medicine? Your condition will be monitored carefully while you are receiving this medicine. What side effects may I notice from receiving this medicine? Side effects that you should report to your doctor or health care professional as soon as possible:  allergic reactions like skin rash, itching or hives, swelling of the face, lips, or tongue  breathing problems  chest pain or chest tightness  dizziness  signs and symptoms of a dangerous change in heartbeat or heart rhythm like chest pain; dizziness; fast or irregular heartbeat; palpitations; feeling faint or lightheaded, falls; breathing problems  signs and symptoms of electrolyte imbalance like severe diarrhea, unusual sweating, vomiting, loss of appetite, increased thirst  swelling of the ankles, legs, or feet  tingling, numbness in the hands or feet Side effects that usually do not require medical attention (report to your doctor or health care professional if they continue or are bothersome):  diarrhea  general ill feeling or flu-like symptoms  headache  nausea  trouble sleeping  stomach pain This list may not describe all possible side effects. Call your doctor for medical advice about side effects. You may report side effects to FDA at  1-800-FDA-1088. Where should I keep my medicine? Keep out of the reach of children. Store at room temperature between 15 and 30 degrees C (59 and 86 degrees F). Throw away any unused medicine after the expiration date. NOTE: This sheet is a summary. It may not cover all possible information. If you have questions about this medicine, talk to your doctor, pharmacist, or health care provider.  2020 Elsevier/Gold Standard (2018-12-06 10:18:48)

## 2020-08-13 ENCOUNTER — Telehealth: Payer: Self-pay | Admitting: Pharmacist

## 2020-08-13 NOTE — Telephone Encounter (Signed)
Medication list reviewed in anticipation of upcoming Tikosyn initiation. Patient is not taking any contraindicated or QTc prolonging medications.   Patient is anticoagulated on Eliquis on the appropriate dose. Please ensure that patient has not missed any anticoagulation doses in the 3 weeks prior to Tikosyn initiation.   Patient will need to be counseled to avoid use of Benadryl while on Tikosyn and in the 2-3 days prior to Tikosyn initiation.  Please ensure K is at least 4 and Mag is at least 2 before stating Tikosyn especially with patient on furosemide.

## 2020-08-14 ENCOUNTER — Encounter (HOSPITAL_COMMUNITY): Payer: Self-pay

## 2020-08-21 ENCOUNTER — Telehealth (HOSPITAL_COMMUNITY): Payer: Self-pay | Admitting: *Deleted

## 2020-08-21 NOTE — Telephone Encounter (Signed)
Left several messages as well as mychart messages to discuss dofetilide admission and current restrictions on admission due to covid increase. Will await call back to further discuss.

## 2020-08-23 ENCOUNTER — Encounter (HOSPITAL_COMMUNITY): Payer: Self-pay

## 2020-08-27 ENCOUNTER — Other Ambulatory Visit: Payer: Self-pay

## 2020-08-27 ENCOUNTER — Telehealth (INDEPENDENT_AMBULATORY_CARE_PROVIDER_SITE_OTHER): Payer: Medicare Other | Admitting: Internal Medicine

## 2020-08-27 DIAGNOSIS — I5022 Chronic systolic (congestive) heart failure: Secondary | ICD-10-CM

## 2020-08-27 DIAGNOSIS — D6869 Other thrombophilia: Secondary | ICD-10-CM | POA: Diagnosis not present

## 2020-08-27 DIAGNOSIS — I4819 Other persistent atrial fibrillation: Secondary | ICD-10-CM

## 2020-08-27 NOTE — Progress Notes (Signed)
Electrophysiology TeleHealth Note  Due to national recommendations of social distancing due to COVID 19, an audio telehealth visit is felt to be most appropriate for this patient at this time.  Verbal consent was obtained by me for the telehealth visit today.  The patient does not have capability for a virtual visit.  A phone visit is therefore required today.   Date:  08/27/2020   ID:  Anita Stanton, Anita Stanton 1956/10/17, MRN [id number]  Location: patient's home  Provider location:  [place]  Evaluation Performed: Follow-up visit  PCP:  Patient, No Pcp Per   Electrophysiologist:  Dr Johney Frame  Chief Complaint:  palpitations  History of Present Illness:    [name] is a 64 y.o. female who presents via telehealth conferencing today.  Since last being seen in our clinic, the patient reports doing reasonably well.  She remains in afib.  We had planned for tikosyn admission however with COVID 19, we do not have beds for tikosyn load.   Today, she denies symptoms of palpitations, chest pain, shortness of breath,  lower extremity edema, dizziness, presyncope, or syncope.  The patient is otherwise without complaint today.     Past Medical History:  Diagnosis Date  . Cardiomyopathy (HCC)   . Hypertension   . Obesity   . Persistent atrial fibrillation [facility])     Past Surgical History:  Procedure Laterality Date  . CARDIOVERSION N/A 07/09/2020   Procedure: CARDIOVERSION;  Surgeon: Pricilla Riffle, MD;  Location: [facility] ENDOSCOPY;  Service: Cardiovascular;  Laterality: N/A;  . TONSILECTOMY, ADENOIDECTOMY, BILATERAL MYRINGOTOMY AND TUBES N/A     Current Outpatient Medications  Medication Sig Dispense Refill  . apixaban (ELIQUIS) 5 MG TABS tablet Take 1 tablet (5 mg total) by mouth 2 (two) times daily. 60 tablet 11  . Ascorbic Acid (VITAMIN C) 1000 MG tablet Take 1,000 mg by mouth daily.    . B Complex Vitamins (VITAMIN-B COMPLEX PO) Take 1 tablet by mouth daily.    Marland Kitchen  diltiazem (CARDIZEM SR) 60 MG 12 hr capsule Take 1 capsule (60 mg total) by mouth 2 (two) times daily. 30 capsule 6  . furosemide (LASIX) 40 MG tablet TAKE 1 TAB (40 MG TOTAL) BY MOUTH DAILY 90 tablet 3  . Lactobacillus (PROBIOTIC ACIDOPHILUS) CAPS Take 1 capsule by mouth daily.    Marland Kitchen losartan (COZAAR) 25 MG tablet Take 0.5 tablets (12.5 mg total) by mouth daily. 30 tablet 3  . Magnesium 100 MG TABS Take 100 mg by mouth daily.    . melatonin 5 MG TABS Take 2.5 mg by mouth at bedtime.     . metoprolol succinate (TOPROL-XL) 200 MG 24 hr tablet Take 1 tablet (200 mg total) by mouth daily. Take with or immediately following a meal. 30 tablet 6  . Multiple Vitamins-Minerals (ZINC PO) Take 10 mg by mouth daily.    Marland Kitchen OVER THE COUNTER MEDICATION Apply 1 application topically 2 (two) times a week. anumed cream  Vit D3 cream     No current facility-administered medications for this visit.    Allergies:   Patient has no known allergies.   Social History:  The patient  reports that she has never smoked. She has never used smokeless tobacco. She reports previous alcohol use. She reports that she does not use drugs.   ROS:  Please see the history of present illness.   All other systems are personally reviewed and negative.    Exam:  Vital Signs:  There were no vitals taken for this visit.  Well sounding, alert and conversant   Labs/Other Tests and Data Reviewed:    Recent Labs: 06/09/2020: B Natriuretic Peptide 319.8 07/06/2020: BUN 25; Creatinine, Ser 0.91; Hemoglobin 13.0; Platelets 271; Potassium 5.1; Sodium 140   Wt Readings from Last 3 Encounters:  08/06/20 216 lb (98 kg)  07/25/20 (!) 219 lb (99.3 kg)  07/09/20 210 lb (95.3 kg)     ASSESSMENT & PLAN:    1.  Persistent afib The patient has symptomatic, recurrent persistent atrial fibrillation.  Chads2vasc score is 4.  she is anticoagulated with eliquis . Therapeutic strategies for afib including medicine (tikosyn/ amiodarone) and  ablation were discussed in detail with the patient today. Risk, benefits, and alternatives to each approach were discussed at length today.  At this time, she is not ready to make a decision.  She will think about these options further and contact our office if she decides to proceed.  She is leaning towards tikosyn.  Given her reduced EF and CHF, I think that this may be the best approach.  Amiodarone would also be reasonable as a transition during COVID 19.  She is clear that she does not want to have general anesthesia for ablation.  2. Recently diagnosed CHF We discussed at length today.  She states that she was not aware of any diagnosis of CHF. She has had multiple documented conversations from her prior office visits and hospitalization with Dr Cristal Deer and her team members including Joni Reining.  I have read their notes to her today and have been very clear with education about CHF and its risks.  I have advised that she have further conversation with Dr Cristal Deer also about further CV risk stratification (she has been documented to have declined cath).   3. COVID-19 She has not been vaccinated.  I would worry about exposure to COVID to this patient.   I have advised that she get vaccinated and she declines.   Risks, benefits and potential toxicities for medications prescribed and/or refilled reviewed with patient today.   Follow-up:  4 weeks in AF clinic She also needs to follow-up with Dr Cristal Deer directly regarding her CHF and further risk stratification.   Patient Risk:  after full review of this patients clinical status, I feel that they are at moderate risk at this time.  Today, I have spent 25 minutes with the patient with telehealth technology discussing arrhythmia management .    SignedHillis Range, MD  08/27/2020 2:22 PM     [facility] [address] [place] [address] [phone] (office) [phone] (fax)

## 2020-08-29 NOTE — Telephone Encounter (Signed)
Can we see if we have a sooner appt to discuss? Thanks.

## 2020-08-30 NOTE — Telephone Encounter (Signed)
Spoke to pt. Virtual appointment scheduled for 9/9 at 9 am.

## 2020-09-06 ENCOUNTER — Telehealth (INDEPENDENT_AMBULATORY_CARE_PROVIDER_SITE_OTHER): Payer: Medicare Other | Admitting: Cardiology

## 2020-09-06 VITALS — Ht 62.0 in | Wt 210.0 lb

## 2020-09-06 DIAGNOSIS — I5022 Chronic systolic (congestive) heart failure: Secondary | ICD-10-CM

## 2020-09-06 DIAGNOSIS — I429 Cardiomyopathy, unspecified: Secondary | ICD-10-CM | POA: Diagnosis not present

## 2020-09-06 DIAGNOSIS — I10 Essential (primary) hypertension: Secondary | ICD-10-CM

## 2020-09-06 DIAGNOSIS — I4819 Other persistent atrial fibrillation: Secondary | ICD-10-CM | POA: Diagnosis not present

## 2020-09-06 DIAGNOSIS — Z7189 Other specified counseling: Secondary | ICD-10-CM

## 2020-09-06 NOTE — Progress Notes (Signed)
Virtual Visit via Telephone Note   This visit type was conducted due to national recommendations for restrictions regarding the COVID-19 Pandemic (e.g. social distancing) in an effort to limit this patient's exposure and mitigate transmission in our community.  Due to her co-morbid illnesses, this patient is at least at moderate risk for complications without adequate follow up.  This format is felt to be most appropriate for this patient at this time.  The patient did not have access to video technology/had technical difficulties with video requiring transitioning to audio format only (telephone).  All issues noted in this document were discussed and addressed.  No physical exam could be performed with this format.  Please refer to the patient's chart for her  consent to telehealth for [facility].    Date:  09/06/2020   ID:  Anita Stanton, Anita Stanton 25-Nov-1956, MRN [id number] The patient was identified using 2 identifiers.  Patient Location: Home Provider Location: Home Office  PCP:  Patient, No Pcp Per  Cardiologist:  Buford Dresser, MD  Electrophysiologist:  None   Evaluation Performed:  Follow-Up Visit  Chief Complaint:  Follow up  History of Present Illness:    Anita Stanton is a 64 y.o. female with PMH atrial fibrillation, cardiomyopathy, chronic systolic and diastolic heart failure. I met her during her hospitalization in 05/2020.  CV history: admitted 05/2020 with new cardiomyopathy, acute systolic and diastolic heart failure, afib RVR, demand ischemia found to have UTI as well. Declined cath/ischemic workup. Declined TEE-CV. Started on losartan, metoprolol succinate. Wished to have cardioversion as an outpatient, performed 07/09/20. Returned to atrial fibrillation several days later. Was seen by Dr. Rayann Heman 08/06/20, discussed ablation vs tikosyn at that time.  The patient does not have symptoms concerning for COVID-19 infection (fever, chills, cough, or new shortness  of breath).   Today: Husband present for visit as well via phone. Doing much better than prior to her hospitalization, but still has significant fatigue. Notices more in legs than arms. Stays active, walks and cares for her animals, but tired after.  We reviewed her echo again at length. Again discussed findings, typical workup, management.   Reviewed Dr. Jackalyn Lombard recent visit and recommendations. Has felt much better since starting diltiazem. More energy, BP improved. Discussed ablation vs. tikosyn admission. Difficulty with beds in the hospital due to the pandemic.   ROS for constant numbness/pruritis of left hip, foot, buttocks, notices more at night. Not affecting muscles, walking without issue.  Denies chest pain, shortness of breath at rest or with normal exertion. No PND, orthopnea, LE edema or unexpected weight gain. No syncope or palpitations.  Takes BP on wrist cuff, before meds this AM: 130/86, 72 bpm at first, then 126/82, 78 bpm.   Past Medical History:  Diagnosis Date  . Cardiomyopathy ([place])   . Hypertension   . Obesity   . Persistent atrial fibrillation [facility])    Past Surgical History:  Procedure Laterality Date  . CARDIOVERSION N/A 07/09/2020   Procedure: CARDIOVERSION;  Surgeon: Fay Records, MD;  Location: [facility] ENDOSCOPY;  Service: Cardiovascular;  Laterality: N/A;  . TONSILECTOMY, ADENOIDECTOMY, BILATERAL MYRINGOTOMY AND TUBES N/A      Current Meds  Medication Sig  . apixaban (ELIQUIS) 5 MG TABS tablet Take 1 tablet (5 mg total) by mouth 2 (two) times daily.  . Ascorbic Acid (VITAMIN C) 1000 MG tablet Take 1,000 mg by mouth daily.  . B Complex Vitamins (VITAMIN-B COMPLEX PO) Take 1 tablet by mouth daily.  Marland Kitchen  diltiazem (CARDIZEM SR) 60 MG 12 hr capsule Take 1 capsule (60 mg total) by mouth 2 (two) times daily.  . furosemide (LASIX) 40 MG tablet TAKE 1 TAB (40 MG TOTAL) BY MOUTH DAILY  . Lactobacillus (PROBIOTIC ACIDOPHILUS) CAPS Take 1 capsule by mouth daily.  Marland Kitchen  losartan (COZAAR) 25 MG tablet Take 0.5 tablets (12.5 mg total) by mouth daily.  . Magnesium 100 MG TABS Take 100 mg by mouth daily.  . melatonin 5 MG TABS Take 2.5 mg by mouth at bedtime.   . metoprolol succinate (TOPROL-XL) 200 MG 24 hr tablet Take 1 tablet (200 mg total) by mouth daily. Take with or immediately following a meal.  . Multiple Vitamins-Minerals (ZINC PO) Take 10 mg by mouth daily.  Marland Kitchen OVER THE COUNTER MEDICATION Apply 1 application topically 2 (two) times a week. anumed cream  Vit D3 cream     Allergies:   Patient has no known allergies.   Social History   Tobacco Use  . Smoking status: Never Smoker  . Smokeless tobacco: Never Used  Substance Use Topics  . Alcohol use: Not Currently    Comment: rare beer on occasion  . Drug use: Never     Family Hx: The patient's family history includes Atrial fibrillation in her mother; Cancer in her mother.  ROS:   Please see the history of present illness.    All other systems reviewed and are negative.   Prior CV studies:   The following studies were reviewed today: Echo 06/10/20 1. LVEF is severely depressed The proximal portion of LV contracts an  distal 2/3 is severely hypokinetic/akinetic . Left ventricular ejection  fraction, by estimation, is 25%%. The left ventricle has severely  decreased function. The left ventricle  demonstrates regional wall motion abnormalities (see scoring  diagram/findings for description). There is mild left ventricular  hypertrophy. Left ventricular diastolic parameters are indeterminate.  2. Proximal portion of RV contracts; mid/distal is akinetic/dyskinetic..  Right ventricular systolic function is severely reduced. The right  ventricular size is normal. There is mildly elevated pulmonary artery  systolic pressure.  3. Left atrial size was moderately dilated.  4. Right atrial size was moderately dilated.  5. The mitral valve is abnormal. Mild mitral valve regurgitation.  6. The  aortic valve is abnormal. Aortic valve regurgitation is trivial.  Mild aortic valve sclerosis is present, with no evidence of aortic valve  stenosis.  7. The inferior vena cava is dilated in size with <50% respiratory  variability, suggesting right atrial pressure of 15 mmHg.   Labs/Other Tests and Data Reviewed:    EKG:  An ECG dated 08/06/20 was personally reviewed today and demonstrated:  afib  Recent Labs: 06/09/2020: B Natriuretic Peptide 319.8 07/06/2020: BUN 25; Creatinine, Ser 0.91; Hemoglobin 13.0; Platelets 271; Potassium 5.1; Sodium 140   Recent Lipid Panel Lab Results  Component Value Date/Time   CHOL 180 06/10/2020 08:07 AM   TRIG 109 06/10/2020 08:07 AM   HDL 53 06/10/2020 08:07 AM   CHOLHDL 3.4 06/10/2020 08:07 AM   LDLCALC 105 (H) 06/10/2020 08:07 AM    Wt Readings from Last 3 Encounters:  09/06/20 210 lb (95.3 kg)  08/06/20 216 lb (98 kg)  07/25/20 (!) 219 lb (99.3 kg)     Objective:    Vital Signs:  Ht '5\' 2"'  (1.575 m)   Wt 210 lb (95.3 kg)   BMI 38.41 kg/m    Speaking comfortably on the phone, no audible wheezing In no acute distress  Alert and oriented Normal affect Normal speech  ASSESSMENT & PLAN:    Cardiomyopathy, with chronic systolic and diastolic heart failure -see notes, declined initial ischemic eval, would like medical management and echo recheck -NYHA class II-III today, significant fatigue after exertion -tolerating losartan, metoprolol succinate -reports no significant swelling on lasix daily -discussed potential role of afib in symptoms, see below -would plan for echo after 3 mos of GDMT, and even better if sinus rhythm can be restored prior to echo -see inpatient notes re: discussion of SGLT2i, though does not have formal diabetes, she does have glucose intolerance (A1c 6.0)  Atrial fibrillation, persistent: -chadsvasc=4 -continue apixaban -we discussed options at length today. She is leaning towards tikosyn admission. Has follow  up with the afib clinic to discuss  Hypertension: no vitals today -continue losartan, metoprolol, lasix as above  CV risk counseling and prevention: -recommend heart healthy/Mediterranean diet, with whole grains, fruits, vegetable, fish, lean meats, nuts, and olive oil. Limit salt. -recommend moderate walking, 3-5 times/week for 30-50 minutes each session. Aim for at least 150 minutes.week. Goal should be pace of 3 miles/hours, or walking 1.5 miles in 30 minutes -recommend avoidance of tobacco products. Avoid excess alcohol. -ASCVD risk score: The 10-year ASCVD risk score Mikey Bussing DC Brooke Bonito., et al., 2013) is: 13.9%   Values used to calculate the score:     Age: 64 years     Sex: Female     Is Non-Hispanic African American: No     Diabetic: No     Tobacco smoker: No     Systolic Blood Pressure: 213 mmHg     Is BP treated: Yes     HDL Cholesterol: 53 mg/dL     Total Cholesterol: 180 mg/dL   Follow up: Echo in 3 mos, then f/u after echo  COVID-19 Education: The signs and symptoms of COVID-19 were discussed with the patient and how to seek care for testing (follow up with PCP or arrange E-visit).  The importance of social distancing was discussed today.  Time:   Today, I have spent 26 minutes with the patient with telehealth technology discussing the above problems.     Medication Adjustments/Labs and Tests Ordered: Current medicines are reviewed at length with the patient today.  Concerns regarding medicines are outlined above.   Patient Instructions  Medication Instructions:  Your Physician recommend you continue on your current medication as directed.    *If you need a refill on your cardiac medications before your next appointment, please call your pharmacy*   Lab Work: None ordered  Testing/Procedures: Your physician has requested that you have an echocardiogram in 3 months. Echocardiography is a painless test that uses sound waves to create images of your heart. It provides  your doctor with information about the size and shape of your heart and how well your heart's chambers and valves are working. This procedure takes approximately one hour. There are no restrictions for this procedure. [address]     Follow-Up: At [facility], you and your health needs are our priority.  As part of our continuing mission to provide you with exceptional heart care, we have created designated Provider Care Teams.  These Care Teams include your primary Cardiologist (physician) and Advanced Practice Providers (APPs -  Physician Assistants and Nurse Practitioners) who all work together to provide you with the care you need, when you need it.  We recommend signing up for the patient portal called "MyChart".  Sign up information  is provided on this After Visit Summary.  MyChart is used to connect with patients for Virtual Visits (Telemedicine).  Patients are able to view lab/test results, encounter notes, upcoming appointments, etc.  Non-urgent messages can be sent to your provider as well.   To learn more about what you can do with MyChart, go to [email or web address].    Your next appointment:   3 month(s)  The format for your next appointment:   In Person  Provider:   Buford Dresser, MD      Signed, Buford Dresser, MD  09/06/2020   [place]

## 2020-09-06 NOTE — Patient Instructions (Signed)
Medication Instructions:  Your Physician recommend you continue on your current medication as directed.    *If you need a refill on your cardiac medications before your next appointment, please call your pharmacy*   Lab Work: None ordered  Testing/Procedures: Your physician has requested that you have an echocardiogram in 3 months. Echocardiography is a painless test that uses sound waves to create images of your heart. It provides your doctor with information about the size and shape of your heart and how well your heart's chambers and valves are working. This procedure takes approximately one hour. There are no restrictions for this procedure. [address]     Follow-Up: At [facility], you and your health needs are our priority.  As part of our continuing mission to provide you with exceptional heart care, we have created designated Provider Care Teams.  These Care Teams include your primary Cardiologist (physician) and Advanced Practice Providers (APPs -  Physician Assistants and Nurse Practitioners) who all work together to provide you with the care you need, when you need it.  We recommend signing up for the patient portal called "MyChart".  Sign up information is provided on this After Visit Summary.  MyChart is used to connect with patients for Virtual Visits (Telemedicine).  Patients are able to view lab/test results, encounter notes, upcoming appointments, etc.  Non-urgent messages can be sent to your provider as well.   To learn more about what you can do with MyChart, go to [email or web address].    Your next appointment:   3 month(s)  The format for your next appointment:   In Person  Provider:   Jodelle Red, MD

## 2020-09-27 ENCOUNTER — Ambulatory Visit: Payer: Medicare Other | Admitting: Cardiology

## 2020-10-02 ENCOUNTER — Other Ambulatory Visit: Payer: Self-pay

## 2020-10-02 ENCOUNTER — Telehealth: Payer: Self-pay | Admitting: Pharmacist

## 2020-10-02 ENCOUNTER — Ambulatory Visit (HOSPITAL_COMMUNITY)
Admission: RE | Admit: 2020-10-02 | Discharge: 2020-10-02 | Disposition: A | Discharge status: Home / Self Care | Payer: Medicare Other | Source: Ambulatory Visit | Attending: Physician Assistant | Admitting: Physician Assistant

## 2020-10-02 VITALS — BP 120/86 | HR 82 | Wt 210.0 lb

## 2020-10-02 DIAGNOSIS — I4819 Other persistent atrial fibrillation: Secondary | ICD-10-CM | POA: Diagnosis not present

## 2020-10-02 DIAGNOSIS — D6869 Other thrombophilia: Secondary | ICD-10-CM | POA: Insufficient documentation

## 2020-10-02 DIAGNOSIS — I480 Paroxysmal atrial fibrillation: Secondary | ICD-10-CM | POA: Insufficient documentation

## 2020-10-02 NOTE — Progress Notes (Signed)
Electrophysiology TeleHealth Note   Audio telehealth visit is felt to be most appropriate for this patient at this time.  See consent below from today for patient consent regarding telehealth for the Atrial Fibrillation Clinic.    Date:  10/02/2020   ID:  Marlaya, Turck [birth year], MRN [id number]  Location: home Provider location: [address] [place], [place] [address] Evaluation Performed: Follow up  PCP:  Patient, No Pcp Per  Primary Cardiologist:  Dr Cristal Deer Primary Electrophysiologist: Dr Johney Frame  Referring Physician: Dr Johney Frame    History of Present Illness: Anita Stanton is a 64 y.o. female who presents via audio/video conferencing for a telehealth visit today. She has a history of persistent atrial fibrillation, chronic systolic dysfunction, HTN. She was initially diagnosed with afib 06/09/20 after presenting with AF with RVR and decompensated CHF (EF 25%).  She was started on eliquis and toprol. She was cardioverted 07/09/20.  Unfortunately, she returned to afib several days later. She was seen by Dr Johney Frame who recommended dofetilide admission. Patient reports she wanted to consider her options before making a decision.   On follow up today, patient reports she has done reasonably well. She does note increased fatigue since her afib was first diagnosed although her heart rates have been better controlled. She denies symptoms of fluid overload. She denies any missed doses of anticoagulation or bleeding issues.   Today, she denies symptoms of palpitations, chest pain, shortness of breath, orthopnea, PND, lower extremity edema, claudication, dizziness, presyncope, syncope, bleeding, or neurologic sequela. The patient is tolerating medications without difficulties and is otherwise without complaint today.    Atrial Fibrillation Risk Factors:  she does have symptoms or diagnosis of sleep apnea. Patient declined sleep study.  she does not have a history of  rheumatic fever.   she has a BMI of Body mass index is 38.41 kg/m.Marland Kitchen Filed Weights   10/02/20 1409  Weight: 95.3 kg    Past Medical History:  Diagnosis Date  . Cardiomyopathy ([facility])   . Hypertension   . Obesity   . Persistent atrial fibrillation [facility])    Past Surgical History:  Procedure Laterality Date  . CARDIOVERSION N/A 07/09/2020   Procedure: CARDIOVERSION;  Surgeon: Pricilla Riffle, MD;  Location: [facility] ENDOSCOPY;  Service: Cardiovascular;  Laterality: N/A;  . TONSILECTOMY, ADENOIDECTOMY, BILATERAL MYRINGOTOMY AND TUBES N/A      Current Outpatient Medications  Medication Sig Dispense Refill  . Alpha-Ketoglutaric Acid CRYS Take 2 tablets by mouth daily.    Marland Kitchen apixaban (ELIQUIS) 5 MG TABS tablet Take 1 tablet (5 mg total) by mouth 2 (two) times daily. 60 tablet 11  . Ascorbic Acid (VITAMIN C) 1000 MG tablet Take 1,000 mg by mouth daily.    . B Complex Vitamins (VITAMIN-B COMPLEX PO) Take 1 tablet by mouth daily.    . Coenzyme Q10 (COQ10) 100 MG CAPS Take 100 mg by mouth daily.    Marland Kitchen D-Ribose POWD Take 5 tablets by mouth daily.    Marland Kitchen diltiazem (CARDIZEM SR) 60 MG 12 hr capsule Take 1 capsule (60 mg total) by mouth 2 (two) times daily. 30 capsule 6  . furosemide (LASIX) 40 MG tablet TAKE 1 TAB (40 MG TOTAL) BY MOUTH DAILY 90 tablet 3  . Lactobacillus (PROBIOTIC ACIDOPHILUS) CAPS Take 1 capsule by mouth daily.    Marland Kitchen losartan (COZAAR) 25 MG tablet Take 0.5 tablets (12.5 mg total) by mouth daily. 30 tablet 3  . Magnesium 100 MG TABS Take  100 mg by mouth daily.    . melatonin 5 MG TABS Take 2.5 mg by mouth at bedtime.     . metoprolol succinate (TOPROL-XL) 200 MG 24 hr tablet Take 1 tablet (200 mg total) by mouth daily. Take with or immediately following a meal. 30 tablet 6  . Multiple Vitamins-Minerals (ZINC PO) Take 10 mg by mouth daily.    . Zinc 100 MG TABS Take 100 mg by mouth 3 (three) times a week.    Marland Kitchen OVER THE COUNTER MEDICATION Apply 1 application topically 2 (two) times a week.  anumed cream  Vit D3 cream     No current facility-administered medications for this encounter.    Allergies:   Patient has no known allergies.   Social History:  The patient  reports that she has never smoked. She has never used smokeless tobacco. She reports previous alcohol use. She reports that she does not use drugs.   Family History:  The patient's  family history includes Atrial fibrillation in her mother; Cancer in her mother.    ROS:  Please see the history of present illness.   All other systems are personally reviewed and negative.    Recent Labs: 06/09/2020: B Natriuretic Peptide 319.8 07/06/2020: BUN 25; Creatinine, Ser 0.91; Hemoglobin 13.0; Platelets 271; Potassium 5.1; Sodium 140  personally reviewed    Other studies reviewed: Echo 06/10/20 demonstrated  1. LVEF is severely depressed The proximal portion of LV contracts an  distal 2/3 is severely hypokinetic/akinetic . Left ventricular ejection  fraction, by estimation, is 25%%. The left ventricle has severely  decreased function. The left ventricle  demonstrates regional wall motion abnormalities (see scoring  diagram/findings for description). There is mild left ventricular  hypertrophy. Left ventricular diastolic parameters are indeterminate.  2. Proximal portion of RV contracts; mid/distal is akinetic/dyskinetic..  Right ventricular systolic function is severely reduced. The right  ventricular size is normal. There is mildly elevated pulmonary artery  systolic pressure.  3. Left atrial size was moderately dilated.  4. Right atrial size was moderately dilated.  5. The mitral valve is abnormal. Mild mitral valve regurgitation.  6. The aortic valve is abnormal. Aortic valve regurgitation is trivial.  Mild aortic valve sclerosis is present, with no evidence of aortic valve  stenosis.  7. The inferior vena cava is dilated in size with <50% respiratory  variability, suggesting right atrial pressure of 15  mmHg.   Epic records reviewed at length today.    CHA2DS2-VASc Score = 4  The patient's score is based upon: CHF History: 1 HTN History: 1 Age : 1 Diabetes History: 0 Stroke History: 0 Vascular Disease History: 0 Gender: 1      ASSESSMENT AND PLAN: 1. Persistent Atrial Fibrillation  The patient's CHA2DS2-VASc score is 4, indicating a 4.8% annual risk of stroke. We had a long discussion about therapeutic options today. We specifically discussed dofetilide and amiodarone.  Patient agreeable to dofetilide admission. Given her severely reduced EF, I feel she should not postpone her admission.  Patient will continue on Eliquis 5 mg BID, states no missed doses No benadryl use PharmD has screened drugs and no QT prolonging drugs on board QTc in SR 453 ms Continue Toprol 200 mg daily  2. Secondary Hypercoagulable State (ICD10:  D68.69) The patient is at significant risk for stroke/thromboembolism based upon her CHA2DS2-VASc Score of 4.  Continue Apixaban (Eliquis).   3. Chronic systolic CHF EF 25% on recent echo. ? Related to afib. Patient refused LHC.  Hopefully this will improved with rate control/SR. Repeat echo scheduled.   4. HTN Stable, no changes today.   Follow-up in the AF clinic next week for dofetilide admission.   Current medicines are reviewed at length with the patient today.   The patient does not have concerns regarding her medicines.  The following changes were made today:  none  Labs/ tests ordered today include: none No orders of the defined types were placed in this encounter.   Patient Risk:  after full review of this patients clinical status, I feel that they are at moderate risk at this time.   Today, I have spent 22 minutes with the patient with telehealth technology discussing the above.    Dalia Heading PA-C 10/02/2020 4:04 PM  Afib Clinic [facility] [address] [place], [place] [address] [phone]   I hereby  voluntarily request, consent and authorize the Atrial Fibrillation Clinic and its employed or contracted physicians, physician assistants, nurse practitioners or other licensed health care professionals (the Practitioner), to provide me with telemedicine health care services (the "Services") as deemed necessary by the treating Practitioner. I acknowledge and consent to receive the Services by the Practitioner via telemedicine. I understand that the telemedicine visit will involve communicating with the Practitioner through live audiovisual communication technology and the disclosure of certain medical information by electronic transmission. I acknowledge that I have been given the opportunity to request an in-person assessment or other available alternative prior to the telemedicine visit and am voluntarily participating in the telemedicine visit.   I understand that I have the right to withhold or withdraw my consent to the use of telemedicine in the course of my care at any time, without affecting my right to future care or treatment, and that the Practitioner or I may terminate the telemedicine visit at any time. I understand that I have the right to inspect all information obtained and/or recorded in the course of the telemedicine visit and may receive copies of available information for a reasonable fee.  I understand that some of the potential risks of receiving the Services via telemedicine include:   Delay or interruption in medical evaluation due to technological equipment failure or disruption;  Information transmitted may not be sufficient (e.g. poor resolution of images) to allow for appropriate medical decision making by the Practitioner; and/or  In rare instances, security protocols could fail, causing a breach of personal health information.   Furthermore, I acknowledge that it is my responsibility to provide information about my medical history, conditions and care that is complete and  accurate to the best of my ability. I acknowledge that Practitioner's advice, recommendations, and/or decision may be based on factors not within their control, such as incomplete or inaccurate data provided by me or distortions of diagnostic images or specimens that may result from electronic transmissions. I understand that the practice of medicine is not an exact science and that Practitioner makes no warranties or guarantees regarding treatment outcomes. I acknowledge that I will receive a copy of this consent concurrently upon execution via email to the email address I last provided but may also request a printed copy by calling the office of the Atrial Fibrillation Clinic.  I understand that my insurance will be billed for this visit.   I have read or had this consent read to me.  I understand the contents of this consent, which adequately explains the benefits and risks of the Services being provided via telemedicine.  I have  been provided ample opportunity to ask questions regarding this consent and the Services and have had my questions answered to my satisfaction.  I give my informed consent for the services to be provided through the use of telemedicine in my medical care  By participating in this telemedicine visit I agree to the above.

## 2020-10-02 NOTE — Telephone Encounter (Signed)
Medication list reviewed in anticipation of upcoming Tikosyn initiation. Patient is not taking any contraindicated or QTc prolonging medications.   Monitor magnesium and potassium closely after Tikosyn initiation. Ensure magnesium >2.0, and potassium >4.0 given patient's concomitant use of furosemide.   Patient is anticoagulated on apixaban (starting 06/12/20) on the appropriate dose. Please ensure that patient has not missed any anticoagulation doses in the 3 weeks prior to Tikosyn initiation.   Patient will need to be counseled to avoid use of Benadryl while on Tikosyn and in the 2-3 days prior to Tikosyn initiation.

## 2020-10-03 ENCOUNTER — Encounter (HOSPITAL_COMMUNITY): Payer: Self-pay

## 2020-10-06 ENCOUNTER — Other Ambulatory Visit (HOSPITAL_COMMUNITY)
Admission: RE | Admit: 2020-10-06 | Discharge: 2020-10-06 | Disposition: A | Discharge status: Home / Self Care | Payer: Medicare Other | Source: Ambulatory Visit | Attending: Physician Assistant | Admitting: Physician Assistant

## 2020-10-06 DIAGNOSIS — Z01812 Encounter for preprocedural laboratory examination: Secondary | ICD-10-CM | POA: Insufficient documentation

## 2020-10-06 DIAGNOSIS — Z20822 Contact with and (suspected) exposure to covid-19: Secondary | ICD-10-CM | POA: Insufficient documentation

## 2020-10-06 LAB — SARS CORONAVIRUS 2 (TAT 6-24 HRS): SARS Coronavirus 2: NEGATIVE

## 2020-10-09 ENCOUNTER — Other Ambulatory Visit: Payer: Self-pay

## 2020-10-09 ENCOUNTER — Encounter (HOSPITAL_COMMUNITY): Payer: Self-pay | Admitting: Internal Medicine

## 2020-10-09 ENCOUNTER — Ambulatory Visit (HOSPITAL_COMMUNITY)
Admission: RE | Admit: 2020-10-09 | Discharge: 2020-10-09 | Disposition: A | Discharge status: Home / Self Care | Payer: Medicare Other | Source: Ambulatory Visit | Attending: Physician Assistant | Admitting: Physician Assistant

## 2020-10-09 ENCOUNTER — Inpatient Hospital Stay (HOSPITAL_COMMUNITY)
Admission: AD | Admit: 2020-10-09 | Discharge: 2020-10-12 | DRG: 309 | Disposition: A | Discharge status: Home / Self Care | Payer: Medicare Other | Source: Ambulatory Visit | Attending: Cardiology | Admitting: Cardiology

## 2020-10-09 VITALS — BP 154/100 | HR 79 | Ht 62.0 in | Wt 212.8 lb

## 2020-10-09 DIAGNOSIS — Z7901 Long term (current) use of anticoagulants: Secondary | ICD-10-CM

## 2020-10-09 DIAGNOSIS — I429 Cardiomyopathy, unspecified: Secondary | ICD-10-CM | POA: Diagnosis present

## 2020-10-09 DIAGNOSIS — I4891 Unspecified atrial fibrillation: Secondary | ICD-10-CM

## 2020-10-09 DIAGNOSIS — I4892 Unspecified atrial flutter: Secondary | ICD-10-CM | POA: Diagnosis not present

## 2020-10-09 DIAGNOSIS — I110 Hypertensive heart disease with heart failure: Secondary | ICD-10-CM | POA: Diagnosis present

## 2020-10-09 DIAGNOSIS — D6869 Other thrombophilia: Secondary | ICD-10-CM

## 2020-10-09 DIAGNOSIS — I5022 Chronic systolic (congestive) heart failure: Secondary | ICD-10-CM | POA: Diagnosis present

## 2020-10-09 DIAGNOSIS — I480 Paroxysmal atrial fibrillation: Secondary | ICD-10-CM | POA: Diagnosis present

## 2020-10-09 DIAGNOSIS — Z20822 Contact with and (suspected) exposure to covid-19: Secondary | ICD-10-CM | POA: Diagnosis present

## 2020-10-09 DIAGNOSIS — I4819 Other persistent atrial fibrillation: Secondary | ICD-10-CM

## 2020-10-09 DIAGNOSIS — Z6839 Body mass index (BMI) 39.0-39.9, adult: Secondary | ICD-10-CM

## 2020-10-09 DIAGNOSIS — E669 Obesity, unspecified: Secondary | ICD-10-CM | POA: Diagnosis present

## 2020-10-09 HISTORY — DX: Unspecified atrial fibrillation: I48.91

## 2020-10-09 LAB — BASIC METABOLIC PANEL
Anion gap: 8 (ref 5–15)
BUN: 21 mg/dL (ref 8–23)
CO2: 28 mmol/L (ref 22–32)
Calcium: 9.2 mg/dL (ref 8.9–10.3)
Chloride: 105 mmol/L (ref 98–111)
Creatinine, Ser: 0.98 mg/dL (ref 0.44–1.00)
GFR, Estimated: >60 mL/min (ref >60)
Glucose, Bld: 117 mg/dL — ABNORMAL HIGH (ref 70–99)
Potassium: 4.8 mmol/L (ref 3.5–5.1)
Sodium: 141 mmol/L (ref 135–145)

## 2020-10-09 LAB — MAGNESIUM: Magnesium: 2.3 mg/dL (ref 1.7–2.4)

## 2020-10-09 MED ORDER — SODIUM CHLORIDE 0.9 % IV SOLN: 250.0000 mL | INTRAVENOUS | Status: DC | PRN

## 2020-10-09 MED ORDER — ASCORBIC ACID 500 MG PO TABS: 1000.0000 mg | ORAL_TABLET | Freq: Every day | ORAL | Status: DC

## 2020-10-09 MED ORDER — COQ10 100 MG PO CAPS: 100.0000 mg | ORAL_CAPSULE | Freq: Every day | ORAL | Status: DC

## 2020-10-09 MED ORDER — METOPROLOL SUCCINATE ER 100 MG PO TB24: 200.0000 mg | ORAL_TABLET | Freq: Every day | ORAL | Status: DC

## 2020-10-09 MED ORDER — ZINC 15 MG PO CAPS: 10.0000 mg | ORAL_CAPSULE | Freq: Every day | ORAL | Status: DC

## 2020-10-09 MED ORDER — RISAQUAD PO CAPS
1.0000 | ORAL_CAPSULE | Freq: Every day | ORAL | Status: DC
  Administered 2020-10-10 – 2020-10-12 (×3): 1 via ORAL
  Filled 2020-10-09 (×3): qty 1

## 2020-10-09 MED ORDER — VITAMIN-B COMPLEX PO TABS: ORAL_TABLET | Freq: Every day | ORAL | Status: DC

## 2020-10-09 MED ORDER — APIXABAN 5 MG PO TABS
5.0000 mg | ORAL_TABLET | Freq: Two times a day (BID) | ORAL | Status: DC
  Administered 2020-10-09 – 2020-10-12 (×6): 5 mg via ORAL
  Filled 2020-10-09 (×7): qty 1

## 2020-10-09 MED ORDER — DOFETILIDE 500 MCG PO CAPS
500.0000 ug | ORAL_CAPSULE | Freq: Two times a day (BID) | ORAL | Status: DC
  Administered 2020-10-09 – 2020-10-10 (×3): 500 ug via ORAL
  Filled 2020-10-09 (×3): qty 1

## 2020-10-09 MED ORDER — OFF THE BEAT BOOK
Freq: Once | Status: AC
  Filled 2020-10-09: qty 1

## 2020-10-09 MED ORDER — MAGNESIUM OXIDE 400 (241.3 MG) MG PO TABS
200.0000 mg | ORAL_TABLET | Freq: Every day | ORAL | Status: DC
  Administered 2020-10-10 – 2020-10-12 (×3): 200 mg via ORAL
  Filled 2020-10-09 (×3): qty 1

## 2020-10-09 MED ORDER — FUROSEMIDE 40 MG PO TABS
40.0000 mg | ORAL_TABLET | Freq: Every day | ORAL | Status: DC
  Administered 2020-10-10 – 2020-10-11 (×2): 40 mg via ORAL
  Filled 2020-10-09 (×3): qty 1

## 2020-10-09 MED ORDER — SODIUM CHLORIDE 0.9% FLUSH
3.0000 mL | Freq: Two times a day (BID) | INTRAVENOUS | Status: DC
  Administered 2020-10-09 – 2020-10-12 (×4): 3 mL via INTRAVENOUS

## 2020-10-09 MED ORDER — SODIUM CHLORIDE 0.9% FLUSH: 3.0000 mL | INTRAVENOUS | Status: DC | PRN

## 2020-10-09 MED ORDER — D-RIBOSE POWD: 5.0000 | Freq: Every day | Status: DC

## 2020-10-09 MED ORDER — DILTIAZEM HCL ER 60 MG PO CP12
60.0000 mg | ORAL_CAPSULE | Freq: Two times a day (BID) | ORAL | Status: DC
  Administered 2020-10-09: 60 mg via ORAL
  Filled 2020-10-09 (×2): qty 1

## 2020-10-09 MED ORDER — MELATONIN 5 MG PO TABS
2.5000 mg | ORAL_TABLET | Freq: Every day | ORAL | Status: DC
  Administered 2020-10-09 – 2020-10-11 (×3): 2.5 mg via ORAL
  Filled 2020-10-09 (×3): qty 1

## 2020-10-09 MED ORDER — ALPHA LIPOIC ACID 200 MG PO CAPS: ORAL_CAPSULE | Freq: Every day | ORAL | Status: DC

## 2020-10-09 MED ORDER — LOSARTAN POTASSIUM 25 MG PO TABS
12.5000 mg | ORAL_TABLET | Freq: Every day | ORAL | Status: DC
  Administered 2020-10-10: 12.5 mg via ORAL
  Filled 2020-10-09: qty 1

## 2020-10-09 NOTE — Progress Notes (Signed)
Pharmacy: Dofetilide (Tikosyn) - Initial Consult Assessment and Electrolyte Replacement  Pharmacy consulted to assist in monitoring and replacing electrolytes in this 64 y.o. female admitted on 10/09/2020 undergoing dofetilide initiation. First dofetilide dose: planned 10/12 2200 PM, 500 mg  Assessment:  Patient Exclusion Criteria: If any screening criteria checked as "Yes", then  patient  should NOT receive dofetilide until criteria item is corrected.  If "Yes" please indicate correction plan.  YES  NO Patient  Exclusion Criteria Correction Plan   []   [x]   Baseline QTc interval is greater than or equal to 440 msec. IF above YES box checked dofetilide contraindicated unless patient has ICD; then may proceed if QTc 500-550 msec or with known ventricular conduction abnormalities may proceed with QTc 550-600 msec. QTc = 453    []   [x]   Patient is known or suspected to have a digoxin level greater than 2 ng/ml: No results found for: DIGOXIN     []   [x]   Creatinine clearance less than 20 ml/min (calculated using Cockcroft-Gault, actual body weight and serum creatinine): Estimated Creatinine Clearance: 61.2 mL/min (by C-G formula based on SCr of 0.98 mg/dL).     []   [x]  Patient has received drugs known to prolong the QT intervals within the last 48 hours (phenothiazines, tricyclics or tetracyclic antidepressants, erythromycin, H-1 antihistamines, cisapride, fluoroquinolones, azithromycin). Drugs not listed above may have an, as yet, undetected potential to prolong the QT interval, updated information on QT prolonging agents is available at this website:QT prolonging agents or www.crediblemeds.org    []   [x]   Patient received a dose of hydrochlorothiazide (Oretic) alone or in any combination including triamterene (Dyazide, Maxzide) in the last 48 hours.    []   [x]  Patient received a medication known to increase dofetilide plasma concentrations prior to initial dofetilide dose:   . Trimethoprim (Primsol, Proloprim) in the last 36 hours . Verapamil (Calan, Verelan) in the last 36 hours or a sustained release dose in the last 72 hours . Megestrol (Megace) in the last 5 days  . Cimetidine (Tagamet) in the last 6 hours . Ketoconazole (Nizoral) in the last 24 hours . Itraconazole (Sporanox) in the last 48 hours  . Prochlorperazine (Compazine) in the last 36 hours     []   [x]   Patient is known to have a history of torsades de pointes; congenital or acquired long QT syndromes.    []   [x]   Patient has received a Class 1 antiarrhythmic with less than 2 half-lives since last dose. (Disopyramide, Quinidine, Procainamide, Lidocaine, Mexiletine, Flecainide, Propafenone)    []   [x]   Patient has received amiodarone therapy in the past 3 months or amiodarone level is greater than 0.3 ng/ml.    Patient has been appropriately anticoagulated with Eliquis.  Labs:    Component Value Date/Time   K 4.8 10/09/2020 1222   MG 2.3 10/09/2020 1222     Plan: Potassium: K >/= 4: Appropriate to initiate Tikosyn, no replacement needed    Magnesium: Mg >2: Appropriate to initiate Tikosyn, no replacement needed     Thank you for allowing pharmacy to participate in this patient's care    , , [facility] Clinical Pharmacist  10/09/2020 5:05 PM   [facility] pharmacy phone numbers are listed on [email or web address]

## 2020-10-09 NOTE — Progress Notes (Signed)
Patient received first dose of Tikosyn this evening and converted to sinus bradycardia in the 50's after a 9.67 second pause.  Cardiology paged and on call updated stated ok to give evening dose of Cardizem.

## 2020-10-09 NOTE — Progress Notes (Signed)
Primary Care Physician: Patient, No Pcp Per Primary Cardiologist: Dr Cristal Deer Primary Electrophysiologist: Dr Johney Frame Referring Physician: Dr Johney Frame    Anita Stanton is a 64 y.o. female with a history of persistent atrial fibrillation, chronic systolic dysfunction, HTN who presents to the [facility] for follow up. She was initially diagnosed with afib 06/09/20 after presenting with AF with RVR and decompensated CHF (EF 25%). She was started on eliquis and toprol. She was cardioverted 07/09/20. Unfortunately, she returned to afib several days later. She was seen by Dr Johney Frame who recommended dofetilide admission. Patient reports she wanted to consider her options before making a decision. Patient is on Eliquis for a CHADS2VASC score of 4.  On follow up today, patient presents for dofetilide admission. She remains in rate controlled afib with symptoms of fatigue. She denies any missed doses of anticoagulation in the last 3 weeks. She has checked on the price of the medication.   Today, she denies symptoms of palpitations, chest pain, shortness of breath, orthopnea, PND, lower extremity edema, dizziness, presyncope, syncope, snoring, daytime somnolence, bleeding, or neurologic sequela. The patient is tolerating medications without difficulties and is otherwise without complaint today.    Atrial Fibrillation Risk Factors:  she does have symptoms or diagnosis of sleep apnea. Patient declined sleep study. she does not have a history of rheumatic fever.   she has a BMI of Body mass index is 38.92 kg/m.Marland Kitchen Filed Weights   10/09/20 1151  Weight: 96.5 kg    Family History  Problem Relation Age of Onset  . Atrial fibrillation Mother   . Cancer Mother      Atrial Fibrillation Management history:  Previous antiarrhythmic drugs: none Previous cardioversions: 07/09/20 Previous ablations: none CHADS2VASC score: 4 Anticoagulation history: Eliquis   Past  Medical History:  Diagnosis Date  . Cardiomyopathy (HCC)   . Hypertension   . Obesity   . Persistent atrial fibrillation [facility])    Past Surgical History:  Procedure Laterality Date  . CARDIOVERSION N/A 07/09/2020   Procedure: CARDIOVERSION;  Surgeon: Pricilla Riffle, MD;  Location: [facility] ENDOSCOPY;  Service: Cardiovascular;  Laterality: N/A;  . TONSILECTOMY, ADENOIDECTOMY, BILATERAL MYRINGOTOMY AND TUBES N/A     Current Outpatient Medications  Medication Sig Dispense Refill  . ALPHA LIPOIC ACID PO Take by mouth. Taking 600mg  by mouth daily    . apixaban (ELIQUIS) 5 MG TABS tablet Take 1 tablet (5 mg total) by mouth 2 (two) times daily. 60 tablet 11  . Ascorbic Acid (VITAMIN C) 1000 MG tablet Take 1,000 mg by mouth daily.    . B Complex Vitamins (VITAMIN-B COMPLEX PO) Take 1 tablet by mouth daily.    . Coenzyme Q10 (COQ10) 100 MG CAPS Take 100 mg by mouth daily.    D-Ribose POWD Take 5 tablets by mouth daily.    Marland Kitchen diltiazem (CARDIZEM SR) 60 MG 12 hr capsule Take 1 capsule (60 mg total) by mouth 2 (two) times daily. 30 capsule 6  . furosemide (LASIX) 40 MG tablet TAKE 1 TAB (40 MG TOTAL) BY MOUTH DAILY 90 tablet 3  . Lactobacillus (PROBIOTIC ACIDOPHILUS) CAPS Take 1 capsule by mouth daily.    Marland Kitchen losartan (COZAAR) 25 MG tablet Take 0.5 tablets (12.5 mg total) by mouth daily. 30 tablet 3  . Magnesium 100 MG TABS Take 100 mg by mouth daily.    . melatonin 5 MG TABS Take 2.5 mg by mouth at bedtime.     . metoprolol  succinate (TOPROL-XL) 200 MG 24 hr tablet Take 1 tablet (200 mg total) by mouth daily. Take with or immediately following a meal. 30 tablet 6  . Multiple Vitamins-Minerals (ZINC PO) Take 10 mg by mouth daily. Sublingual tablet- three times weekly    . OVER THE COUNTER MEDICATION Apply 1 application topically 2 (two) times a week. anumed cream  Vit D3 cream     No current facility-administered medications for this encounter.    No Known Allergies  Social History   Socioeconomic  History  . Marital status: Married    Spouse name: Not on file  . Number of children: Not on file  . Years of education: Not on file  . Highest education level: Not on file  Occupational History  . Not on file  Tobacco Use  . Smoking status: Never Smoker  . Smokeless tobacco: Never Used  Substance and Sexual Activity  . Alcohol use: Not Currently    Comment: rare beer on occasion  . Drug use: Never  . Sexual activity: Not on file  Other Topics Concern  . Not on file  Social History Narrative   Lives in [place],  Recently retired IT consultant   Social Determinants of Corporate investment banker Strain:   . Difficulty of Paying Living Expenses: Not on file  Food Insecurity:   . Worried About Programme researcher, broadcasting/film/video in the Last Year: Not on file  . Ran Out of Food in the Last Year: Not on file  Transportation Needs:   . Lack of Transportation (Medical): Not on file  . Lack of Transportation (Non-Medical): Not on file  Physical Activity:   . Days of Exercise per Week: Not on file  . Minutes of Exercise per Session: Not on file  Stress:   . Feeling of Stress : Not on file  Social Connections:   . Frequency of Communication with Friends and Family: Not on file  . Frequency of Social Gatherings with Friends and Family: Not on file  . Attends Religious Services: Not on file  . Active Member of Clubs or Organizations: Not on file  . Attends Banker Meetings: Not on file  . Marital Status: Not on file  Intimate Partner Violence:   . Fear of Current or Ex-Partner: Not on file  . Emotionally Abused: Not on file  . Physically Abused: Not on file  . Sexually Abused: Not on file     ROS- All systems are reviewed and negative except as per the HPI above.  Physical Exam: Vitals:   10/09/20 1151  BP: (!) 154/100  Pulse: 79  Weight: 96.5 kg  Height: 5\' 2"  (1.575 m)    GEN- The patient is well appearing obese female, alert and oriented x 3 today.   HEENT-head  normocephalic, atraumatic, sclera clear, conjunctiva pink, hearing intact, trachea midline. Lungs- Clear to ausculation bilaterally, normal work of breathing Heart- irregular rate and rhythm, no murmurs, rubs or gallops  GI- soft, NT, ND, + BS Extremities- no clubbing, cyanosis, or edema MS- no significant deformity or atrophy Skin- no rash or lesion Psych- euthymic mood, full affect Neuro- strength and sensation are intact   Wt Readings from Last 3 Encounters:  10/09/20 96.5 kg  10/02/20 95.3 kg  09/06/20 95.3 kg    EKG today demonstrates afib HR 79, QRS 86, QTc 465  Echo 06/10/20 demonstrated  1. LVEF is severely depressed The proximal portion of LV contracts an  distal 2/3 is severely hypokinetic/akinetic .  Left ventricular ejection  fraction, by estimation, is 25%%. The left ventricle has severely  decreased function. The left ventricle  demonstrates regional wall motion abnormalities (see scoring  diagram/findings for description). There is mild left ventricular  hypertrophy. Left ventricular diastolic parameters are indeterminate.  2. Proximal portion of RV contracts; mid/distal is akinetic/dyskinetic..  Right ventricular systolic function is severely reduced. The right  ventricular size is normal. There is mildly elevated pulmonary artery  systolic pressure.  3. Left atrial size was moderately dilated.  4. Right atrial size was moderately dilated.  5. The mitral valve is abnormal. Mild mitral valve regurgitation.  6. The aortic valve is abnormal. Aortic valve regurgitation is trivial.  Mild aortic valve sclerosis is present, with no evidence of aortic valve  stenosis.  7. The inferior vena cava is dilated in size with <50% respiratory  variability, suggesting right atrial pressure of 15 mmHg.   Epic records are reviewed at length today  CHA2DS2-VASc Score = 4  The patient's score is based upon: CHF History: 1 HTN History: 1 Diabetes History: 0 Stroke  History: 0 Vascular Disease History: 0      ASSESSMENT AND PLAN: 1. Persistent Atrial Fibrillation (ICD10:  I48.19) The patient's CHA2DS2-VASc score is 4, indicating a 4.8% annual risk of stroke.   Patient wants to pursue dofetilide, aware of risk vrs benefit Aware of price of dofetilide Patient will continue on Eliquis 5 mg BID, states no missed doses No benadryl use PharmD has screened drugs and no QT prolonging drugs on board QTc in SR 453 ms, Labs today show creatinine at 0.98, K+ 4.8 and mag 2.3, CrCl calculated at 86 mL/min  2. Secondary Hypercoagulable State (ICD10:  D68.69) The patient is at significant risk for stroke/thromboembolism based upon her CHA2DS2-VASc Score of 4.  Continue Apixaban (Eliquis).   3. Obesity Body mass index is 38.92 kg/m. Lifestyle modification was discussed at length including regular exercise and weight reduction.  4. Chronic systolic CHF EF 25% by echo Possibly related to afib. Patient refused LHC. Hopefully this will improve with SR. No signs or symptoms of acute fluid overload.  5. HTN Stable, no changes today.   To be admitted later today once a bed becomes available.    Jorja Loa PA-C [facility] Clinic [facility] [address] [place], [place] [address] [phone] 10/09/2020 1:42 PM

## 2020-10-09 NOTE — Progress Notes (Signed)
PHARMACIST - PHYSICIAN ORDER COMMUNICATION  CONCERNING: P&T Medication Policy on Herbal Medications  DESCRIPTION:  This patient's order for:  CoQ10, D-Ribose, Alpha Lipoic Acid  has been noted.  This product(s) is classified as an "herbal" or natural product. Due to a lack of definitive safety studies or FDA approval, nonstandard manufacturing practices, plus the potential risk of unknown drug-drug interactions while on inpatient medications, the Pharmacy and Therapeutics Committee does not permit the use of "herbal" or natural products of this type within [facility].   ACTION TAKEN: The pharmacy department is unable to verify this order at this time and your patient has been informed of this safety policy. Please reevaluate patient's clinical condition at discharge and address if the herbal or natural product(s) should be resumed at that time.  Leonia Corona, PharmD Pharmacist

## 2020-10-10 DIAGNOSIS — I4819 Other persistent atrial fibrillation: Secondary | ICD-10-CM | POA: Diagnosis not present

## 2020-10-10 LAB — BASIC METABOLIC PANEL
Anion gap: 9 (ref 5–15)
BUN: 22 mg/dL (ref 8–23)
CO2: 27 mmol/L (ref 22–32)
Calcium: 9 mg/dL (ref 8.9–10.3)
Chloride: 106 mmol/L (ref 98–111)
Creatinine, Ser: 1.01 mg/dL — ABNORMAL HIGH (ref 0.44–1.00)
GFR, Estimated: 58 mL/min — ABNORMAL LOW (ref >60)
Glucose, Bld: 125 mg/dL — ABNORMAL HIGH (ref 70–99)
Potassium: 4.6 mmol/L (ref 3.5–5.1)
Sodium: 142 mmol/L (ref 135–145)

## 2020-10-10 LAB — HIV ANTIBODY (ROUTINE TESTING W REFLEX): HIV Screen 4th Generation wRfx: NONREACTIVE

## 2020-10-10 LAB — MAGNESIUM: Magnesium: 2.1 mg/dL (ref 1.7–2.4)

## 2020-10-10 MED ORDER — ASCORBIC ACID 500 MG PO TABS
1000.0000 mg | ORAL_TABLET | Freq: Every day | ORAL | Status: DC
  Administered 2020-10-10 – 2020-10-12 (×3): 1000 mg via ORAL
  Filled 2020-10-10 (×3): qty 2

## 2020-10-10 NOTE — TOC Benefit Eligibility Note (Signed)
Transition of Care [facility]) Benefit Eligibility Note    Patient Details  Name: Anita Stanton MRN: [id number] Date of Birth: 04-Jul-1956   Medication/Dose: DOFETILIDE  125 MCG BID     250 MCG BID    500 MCG BID  Covered?: Yes  Tier:  (TIER- 4 DRUG)  Prescription Coverage [facility]erred [facility]  Spoke with Person/Company/Phone Number:: [facility] @  [employer] # [phone]  Co-Pay: 125 MCG BID - $111.41        250 MCG BID- $120.59      500 MCG BID $ 121.49     Deductible: Unmet  Additional Notes: TIKOSYN -NOT COVER / NON-FORMULARY  P/A -YES  #   [id number]    [name] Phone Number: 10/10/2020, 12:17 PM

## 2020-10-10 NOTE — Progress Notes (Signed)
Pharmacy: Dofetilide (Tikosyn) - Follow Up Assessment and Electrolyte Replacement  Pharmacy consulted to assist in monitoring and replacing electrolytes in this 64 y.o. female admitted on 10/09/2020 undergoing dofetilide initiation. First dofetilide dose: 10/10/20.  Labs:    Component Value Date/Time   K 4.6 10/10/2020 0615   MG 2.1 10/10/2020 0615     Plan: Potassium: K >/= 4: No additional supplementation needed  Magnesium: Mg > 2: No additional supplementation needed   Thank you for allowing pharmacy to participate in this patient's care   Fredonia Highland, PharmD, BCPS Clinical Pharmacist [phone] Please check AMION for all [facility] Pharmacy numbers 10/10/2020

## 2020-10-10 NOTE — Progress Notes (Addendum)
Progress Note  Patient Name: Anita Stanton Date of Encounter: 10/10/2020  [facility] Cardiologist: Jodelle Red, MD   Subjective   Was awake with the post conversion paus and did feel lightheaded, dizzy though only briefly "maybe 3 seconds".  NO CP, she otherwise as of yet can not tell the difference in SR No CP or cardiac awareness, no SOB  Inpatient Medications    Scheduled Meds: . acidophilus  1 capsule Oral Daily  . apixaban  5 mg Oral BID  . vitamin C  1,000 mg Oral Daily  . diltiazem  60 mg Oral BID  . dofetilide  500 mcg Oral BID  . furosemide  40 mg Oral Daily  . losartan  12.5 mg Oral Daily  . magnesium oxide  200 mg Oral Daily  . melatonin  2.5 mg Oral QHS  . metoprolol  200 mg Oral Daily  . sodium chloride flush  3 mL Intravenous Q12H   Continuous Infusions: . sodium chloride     PRN Meds: sodium chloride, sodium chloride flush   Vital Signs    Vitals:   10/09/20 2020 10/09/20 2253 10/10/20 0057 10/10/20 0416  BP: 132/88 (!) 145/76  (!) 143/81  Pulse: 67 (!) 55  (!) 54  Resp:      Temp: 98.4 F (36.9 C)   98 F (36.7 C)  TempSrc: Oral  Oral Oral  Weight:    97.7 kg  Height:        Intake/Output Summary (Last 24 hours) at 10/10/2020 0801 Last data filed at 10/10/2020 4818 Gross per 24 hour  Intake 123 ml  Output 1150 ml  Net -1027 ml   Last 3 Weights 10/10/2020 10/09/2020 10/09/2020  Weight (lbs) 215 lb 6.2 oz 213 lb 8 oz 212 lb 12.8 oz  Weight (kg) 97.7 kg 96.843 kg 96.525 kg      Telemetry    AFib >> SB with a post conversion pause of 9.6seconds, sinus rates 48-50's- Personally Reviewed  ECG    Post dose EKG is SB 55bpm, manually measured QT 480-534ms > QTc 460-450ms, 1st degree AVblock - Personally Reviewed   Pre-Tikosyn her last SR EKG 07/09/2020 is SB 51bpm, manually measured  455ms/QTc/QTc 443  Physical Exam   GEN: No acute distress.   Neck: No JVD Cardiac: RRR, no murmurs, rubs, or gallops.    Respiratory: CTA b/l. GI: Soft, nontender, non-distended  MS: No edema; No deformity. Neuro:  Nonfocal  Psych: Normal affect   Labs    High Sensitivity Troponin:  No results for input(s): TROPONINIHS in the last 720 hours.    Chemistry Recent Labs  Lab 10/09/20 1222 10/10/20 0615  NA 141 142  K 4.8 4.6  CL 105 106  CO2 28 27  GLUCOSE 117* 125*  BUN 21 22  CREATININE 0.98 1.01*  CALCIUM 9.2 9.0  GFRNONAA >60 58*  ANIONGAP 8 9     HematologyNo results for input(s): WBC, RBC, HGB, HCT, MCV, MCH, MCHC, RDW, PLT in the last 168 hours.  BNPNo results for input(s): BNP, PROBNP in the last 168 hours.   DDimer No results for input(s): DDIMER in the last 168 hours.   Radiology    No results found.  Cardiac Studies   06/10/2020: TTE IMPRESSIONS  1. LVEF is severely depressed The proximal portion of LV contracts an  distal 2/3 is severely hypokinetic/akinetic . Left ventricular ejection  fraction, by estimation, is 25%%. The left ventricle has severely  decreased function.  The left ventricle  demonstrates regional wall motion abnormalities (see scoring  diagram/findings for description). There is mild left ventricular  hypertrophy. Left ventricular diastolic parameters are indeterminate.  2. Proximal portion of RV contracts; mid/distal is akinetic/dyskinetic..  Right ventricular systolic function is severely reduced. The right  ventricular size is normal. There is mildly elevated pulmonary artery  systolic pressure.  3. Left atrial size was moderately dilated.  4. Right atrial size was moderately dilated.  5. The mitral valve is abnormal. Mild mitral valve regurgitation.  6. The aortic valve is abnormal. Aortic valve regurgitation is trivial.  Mild aortic valve sclerosis is present, with no evidence of aortic valve  stenosis.  7. The inferior vena cava is dilated in size with <50% respiratory  variability, suggesting right atrial pressure of 15 mmHg.     Patient Profile     64 y.o. female w/PMHx of HTN, obesity, recently found AFib and new CM and CHF (suspcet 2/2 AFib, though pt declined cath for evaluation of coronaries).  She had ERAF after DCCV and is admitted for Tikosyn initiation.  Assessment & Plan    1. Persistent AFib     CHA2DS2Vasc is 4, on Eliquis, appropriately dosed     Tikosyn load is in progress      K+ 4.6      Mag 2.1      Creat 1.01 (Calc CrCl is 85)      QTc stable  She had along and symptomatic post conversion pause, I will stop her diltiazem and reduce her Toprol with awake HR 49-50's and move to evening dose Discuss with Dr. Johney Frame   Discussed with RN, OK for Tikosyn this AM  2. HTN     As above, will adjust meds as needed  3. CM, CHF      Exam is euvolemic      Continue her home med, will be adjusting her BB as above          For questions or updates, please contact [facility] Please consult [email or web address] for contact info under        Signed, Sheilah Pigeon, PA-C  10/10/2020, 8:01 AM     I have seen, examined the patient, and reviewed the above assessment and plan.  Changes to above are made where necessary.  On exam, bradycardic rhythm.  She is ambulatory.  Continue tikosyn load.  Hold metoprolol and diltiazem and continue to follow rhythm closely.  Do not hold tikosyn for bradycardia.  EP to follow closely.  Co Sign: Hillis Range, MD 10/10/2020 4:25 PM

## 2020-10-10 NOTE — Progress Notes (Addendum)
Post dose EKG is reviewed with Dr. Johney Frame.  QT is moving out some and borderline.  Will continue dose tonight. I reviewed telemetry, maintaining SB 50's, some 48-50's, no arrhythmias  Francis Dowse, PA-C   ekg personally reviewed by me.  Continue tikosyn with close follow-up.  Hillis Range MD, [facility] 10/10/2020 4:26 PM

## 2020-10-10 NOTE — Care Management (Signed)
10-10-20 1105 Patient presented for Tikosyn Load. Benefits check submitted for Tikosyn. Case Manager will follow for cost. Graves-Bigelow, Lamar Laundry, RN,BSN Case Manager

## 2020-10-10 NOTE — H&P (Signed)
Primary Care Physician: System, Provider Not In Primary Cardiologist: Dr Cristal Deer Primary Electrophysiologist: Dr Johney Frame Referring Physician: Dr Johney Frame    Anita Stanton is a 64 y.o. female with a history of persistent atrial fibrillation, chronic systolic dysfunction, HTN who presents to the [facility] for follow up. She was initially diagnosed with afib 06/09/20 after presenting with AF with RVR and decompensated CHF (EF 25%). She was started on eliquis and toprol. She was cardioverted 07/09/20. Unfortunately, she returned to afib several days later. She was seen by Dr Johney Frame who recommended dofetilide admission. Patient reports she wanted to consider her options before making a decision. Patient is on Eliquis for a CHADS2VASC score of 4.  On follow up today, patient presents for dofetilide admission. She remains in rate controlled afib with symptoms of fatigue. She denies any missed doses of anticoagulation in the last 3 weeks. She has checked on the price of the medication.   Today, she denies symptoms of palpitations, chest pain, shortness of breath, orthopnea, PND, lower extremity edema, dizziness, presyncope, syncope, snoring, daytime somnolence, bleeding, or neurologic sequela. The patient is tolerating medications without difficulties and is otherwise without complaint today.    Atrial Fibrillation Risk Factors:  she does have symptoms or diagnosis of sleep apnea. Patient declined sleep study. she does not have a history of rheumatic fever.   she has a BMI of Body mass index is 39.4 kg/m.Marland Kitchen Filed Weights   10/09/20 1718 10/10/20 0416  Weight: 96.8 kg 97.7 kg    Family History  Problem Relation Age of Onset  . Atrial fibrillation Mother   . Cancer Mother      Atrial Fibrillation Management history:  Previous antiarrhythmic drugs: none Previous cardioversions: 07/09/20 Previous ablations: none CHADS2VASC score: 4 Anticoagulation  history: Eliquis   Past Medical History:  Diagnosis Date  . Atrial fibrillation (HCC) 10/09/2020  . Cardiomyopathy (HCC)   . Hypertension   . Obesity   . Persistent atrial fibrillation [facility])    Past Surgical History:  Procedure Laterality Date  . CARDIOVERSION N/A 07/09/2020   Procedure: CARDIOVERSION;  Surgeon: Pricilla Riffle, MD;  Location: [facility] ENDOSCOPY;  Service: Cardiovascular;  Laterality: N/A;  . TONSILECTOMY, ADENOIDECTOMY, BILATERAL MYRINGOTOMY AND TUBES N/A     Current Facility-Administered Medications  Medication Dose Route Frequency Provider Last Rate Last Admin  . 0.9 %  sodium chloride infusion  250 mL Intravenous PRN Fenton, Clint R, PA      . acidophilus (RISAQUAD) capsule 1 capsule  1 capsule Oral Daily Fenton, Clint R, PA   1 capsule at 10/10/20 1055  . apixaban (ELIQUIS) tablet 5 mg  5 mg Oral BID Fenton, Clint R, PA   5 mg at 10/10/20 1055  . ascorbic acid (VITAMIN C) tablet 1,000 mg  1,000 mg Oral Daily Mosetta Anis, RPH   1,000 mg at 10/10/20 1134  . dofetilide (TIKOSYN) capsule 500 mcg  500 mcg Oral BID Fenton, Clint R, PA   500 mcg at 10/10/20 0850  . furosemide (LASIX) tablet 40 mg  40 mg Oral Daily Fenton, Clint R, PA   40 mg at 10/10/20 1055  . losartan (COZAAR) tablet 12.5 mg  12.5 mg Oral Daily Fenton, Clint R, PA   12.5 mg at 10/10/20 1055  . magnesium oxide (MAG-OX) tablet 200 mg  200 mg Oral Daily Fenton, Clint R, PA   200 mg at 10/10/20 1055  . melatonin tablet 2.5 mg  2.5 mg Oral  QHS Fenton, Clint R, PA   2.5 mg at 10/09/20 2254  . sodium chloride flush (NS) 0.9 % injection 3 mL  3 mL Intravenous Q12H Fenton, Clint R, PA   3 mL at 10/10/20 1057  . sodium chloride flush (NS) 0.9 % injection 3 mL  3 mL Intravenous PRN Fenton, Clint R, PA        No Known Allergies  Social History   Socioeconomic History  . Marital status: Married    Spouse name: Not on file  . Number of children: Not on file  . Years of education: Not on file  . Highest  education level: Not on file  Occupational History  . Not on file  Tobacco Use  . Smoking status: Never Smoker  . Smokeless tobacco: Never Used  Vaping Use  . Vaping Use: Never used  Substance and Sexual Activity  . Alcohol use: Not Currently    Comment: rare beer on occasion  . Drug use: Never  . Sexual activity: Not on file  Other Topics Concern  . Not on file  Social History Narrative   Lives in [place],  Recently retired IT consultant   Social Determinants of Corporate investment banker Strain:   . Difficulty of Paying Living Expenses: Not on file  Food Insecurity:   . Worried About Programme researcher, broadcasting/film/video in the Last Year: Not on file  . Ran Out of Food in the Last Year: Not on file  Transportation Needs:   . Lack of Transportation (Medical): Not on file  . Lack of Transportation (Non-Medical): Not on file  Physical Activity:   . Days of Exercise per Week: Not on file  . Minutes of Exercise per Session: Not on file  Stress:   . Feeling of Stress : Not on file  Social Connections:   . Frequency of Communication with Friends and Family: Not on file  . Frequency of Social Gatherings with Friends and Family: Not on file  . Attends Religious Services: Not on file  . Active Member of Clubs or Organizations: Not on file  . Attends Banker Meetings: Not on file  . Marital Status: Not on file  Intimate Partner Violence:   . Fear of Current or Ex-Partner: Not on file  . Emotionally Abused: Not on file  . Physically Abused: Not on file  . Sexually Abused: Not on file     ROS- All systems are reviewed and negative except as per the HPI above.  Physical Exam: Vitals:   10/09/20 2253 10/10/20 0057 10/10/20 0416 10/10/20 1407  BP: (!) 145/76  (!) 143/81 (!) 154/89  Pulse: (!) 55  (!) 54 (!) 55  Resp:    14  Temp:   98 F (36.7 C)   TempSrc:  Oral Oral Oral  SpO2:    100%  Weight:   97.7 kg   Height:        GEN- The patient is well appearing obese female,  alert and oriented x 3 today.   HEENT-head normocephalic, atraumatic, sclera clear, conjunctiva pink, hearing intact, trachea midline. Lungs- Clear to ausculation bilaterally, normal work of breathing Heart- irregular rate and rhythm, no murmurs, rubs or gallops  GI- soft, NT, ND, + BS Extremities- no clubbing, cyanosis, or edema MS- no significant deformity or atrophy Skin- no rash or lesion Psych- euthymic mood, full affect Neuro- strength and sensation are intact   Wt Readings from Last 3 Encounters:  10/10/20 97.7 kg  10/09/20  96.5 kg  10/02/20 95.3 kg    EKG today demonstrates afib HR 79, QRS 86, QTc 465  Echo 06/10/20 demonstrated  1. LVEF is severely depressed The proximal portion of LV contracts an  distal 2/3 is severely hypokinetic/akinetic . Left ventricular ejection  fraction, by estimation, is 25%%. The left ventricle has severely  decreased function. The left ventricle  demonstrates regional wall motion abnormalities (see scoring  diagram/findings for description). There is mild left ventricular  hypertrophy. Left ventricular diastolic parameters are indeterminate.  2. Proximal portion of RV contracts; mid/distal is akinetic/dyskinetic..  Right ventricular systolic function is severely reduced. The right  ventricular size is normal. There is mildly elevated pulmonary artery  systolic pressure.  3. Left atrial size was moderately dilated.  4. Right atrial size was moderately dilated.  5. The mitral valve is abnormal. Mild mitral valve regurgitation.  6. The aortic valve is abnormal. Aortic valve regurgitation is trivial.  Mild aortic valve sclerosis is present, with no evidence of aortic valve  stenosis.  7. The inferior vena cava is dilated in size with <50% respiratory  variability, suggesting right atrial pressure of 15 mmHg.   Epic records are reviewed at length today  CHA2DS2-VASc Score = 4  The patient's score is based upon: CHF History: 1 HTN  History: 1 Diabetes History: 0 Stroke History: 0 Vascular Disease History: 0      ASSESSMENT AND PLAN: 1. Persistent Atrial Fibrillation (ICD10:  I48.19) The patient's CHA2DS2-VASc score is 4, indicating a 4.8% annual risk of stroke.   Patient wants to pursue dofetilide, aware of risk vrs benefit Aware of price of dofetilide Patient will continue on Eliquis 5 mg BID, states no missed doses No benadryl use PharmD has screened drugs and no QT prolonging drugs on board QTc in SR 453 ms, Labs today show creatinine at 0.98, K+ 4.8 and mag 2.3, CrCl calculated at 86 mL/min  2. Secondary Hypercoagulable State (ICD10:  D68.69) The patient is at significant risk for stroke/thromboembolism based upon her CHA2DS2-VASc Score of 4.  Continue Apixaban (Eliquis).   3. Obesity Body mass index is 39.4 kg/m. Lifestyle modification was discussed at length including regular exercise and weight reduction.  4. Chronic systolic CHF EF 25% by echo Possibly related to afib. Patient refused LHC. Hopefully this will improve with SR. No signs or symptoms of acute fluid overload.  5. HTN Stable, no changes today.   To be admitted later today once a bed becomes available.    Jorja Loa PA-C Afib Clinic [facility] [address] [place], [place] [address] [phone]    I have seen, examined the patient, and reviewed the above assessment and plan.  Changes to above are made where necessary.  On exam, iRRR.  The patient has symptomatic refractory afib.  We will admit for initiation of tikosyn.  She reports compliance with eliquis without interruption.  Co Sign: Hillis Range, MD

## 2020-10-11 DIAGNOSIS — I4819 Other persistent atrial fibrillation: Secondary | ICD-10-CM | POA: Diagnosis not present

## 2020-10-11 LAB — BASIC METABOLIC PANEL
Anion gap: 10 (ref 5–15)
BUN: 22 mg/dL (ref 8–23)
CO2: 26 mmol/L (ref 22–32)
Calcium: 9.2 mg/dL (ref 8.9–10.3)
Chloride: 103 mmol/L (ref 98–111)
Creatinine, Ser: 0.96 mg/dL (ref 0.44–1.00)
GFR, Estimated: >60 mL/min (ref >60)
Glucose, Bld: 97 mg/dL (ref 70–99)
Potassium: 4.1 mmol/L (ref 3.5–5.1)
Sodium: 139 mmol/L (ref 135–145)

## 2020-10-11 LAB — MAGNESIUM: Magnesium: 2 mg/dL (ref 1.7–2.4)

## 2020-10-11 MED ORDER — METOPROLOL TARTRATE 5 MG/5ML IV SOLN
2.5000 mg | INTRAVENOUS | Status: DC | PRN
  Administered 2020-10-11 – 2020-10-12 (×2): 2.5 mg via INTRAVENOUS
  Filled 2020-10-11 (×2): qty 5

## 2020-10-11 MED ORDER — LOSARTAN POTASSIUM 25 MG PO TABS
25.0000 mg | ORAL_TABLET | Freq: Every day | ORAL | Status: DC
  Administered 2020-10-11: 25 mg via ORAL
  Filled 2020-10-11: qty 1

## 2020-10-11 MED ORDER — LOSARTAN POTASSIUM 50 MG PO TABS: 50.0000 mg | ORAL_TABLET | Freq: Every day | ORAL | Status: DC

## 2020-10-11 MED ORDER — HYDRALAZINE HCL 20 MG/ML IJ SOLN: 10.0000 mg | INTRAMUSCULAR | Status: DC | PRN

## 2020-10-11 MED ORDER — DOFETILIDE 250 MCG PO CAPS
250.0000 ug | ORAL_CAPSULE | Freq: Two times a day (BID) | ORAL | Status: DC
  Administered 2020-10-11 – 2020-10-12 (×3): 250 ug via ORAL
  Filled 2020-10-11 (×3): qty 1

## 2020-10-11 MED ORDER — LOSARTAN POTASSIUM 25 MG PO TABS
25.0000 mg | ORAL_TABLET | Freq: Once | ORAL | Status: AC
  Administered 2020-10-11: 25 mg via ORAL
  Filled 2020-10-11: qty 1

## 2020-10-11 NOTE — Care Management (Signed)
1646 10-11-20 Case Manager spoke with patient regarding Tikosyn. Case Manager spoke with patient and she wants to get first fill of Tikosyn here via [facility]. Patient wants to discuss with husband regarding where to send refills; either [facility]. Patient wants to use the [facility] app. Case Manager will continue to follow. Gala Lewandowsky, RN,BSN Case Manager

## 2020-10-11 NOTE — Progress Notes (Signed)
Heart Failure Stewardship Pharmacist Progress Note   PCP: System, Provider Not In PCP-Cardiologist: Jodelle Red, MD    HPI:  64 YO female with history of persistent Afib, chronic systolic dysfunction and HTN. Patient currently admitted for dofetilide inititation. EF 25% (June 2021).  Current HF Medications: Furosemide 40mg  daily, losartan 25 mg daily, magnesium 200 mg daily  *BB held due to low HR  Prior to admission HF Medications: Furosemide 40 mg daily, losartan 12.5 mg daily, metoprolol succinate 200 mg daily, magnesium 100 mg daily  Pertinent Lab Values: . Serum creatinine 0.96, BUN 22, Potassium 4.1, Sodium 139, BNP 319 (June 2021), Magnesium 2.0  Vital Signs: . Weight: 97.75 kg (admission weight: 96.5 kg) . Blood pressure: 175/88 . Heart rate: 67 (has been in 50's since admission)  Medication Assistance / Insurance Benefits Check: Does the patient have prescription insurance?  Yes Type of insurance plan: Medicare A/B, Aetna Supplemental Plan    Does the patient qualify for medication assistance through manufacturers or grants?   Pending - household income info . Eligible grants and/or patient assistance programs: Entresto . Medication assistance applications in progress: None . Medication assistance applications approved: None Approved medication assistance renewals will be completed by: Dr. 09-19-1987 office   Outpatient Pharmacy:  [facility] admission [facility] [address]  Is the patient willing to use [facility] TOC pharmacy at discharge? Yes Is the patient willing to transition their outpatient pharmacy to utilize a [facility] outpatient pharmacy?   Pending    Assessment: 1. Chronic systolic CHF (EF [facility]), likely due to persistent afib. NYHA class II symptoms.  Given elevated BP and significantly reduced EF (25%), patient could strongly benefit from [facility] therapy. Patient has tolerated losartan well, therefore, no concerns with the  medication class. Renal/electrolytes remain stable.    Patient appears to remain euvolemic, recommend continuing furosemide 40 mg daily  Metoprolol succinate currently being held due to bradycardia since admission - recommending holding until HR returns to a more consistently stable measurement  Plan: 1) Medication changes recommended at this time: - Will recommend switching losartan to Entresto 24/26 mg BID.   2) Patient assistance application(s): -Entresto copay $47/month -No applications currently in progress  3)  Education  - To be completed prior to discharge  [facility], PharmD PGY-1 Community Pharmacy Resident   10/11/2020 10:18 AM  10/13/2020, PharmD, BCPS Heart Failure Stewardship Pharmacist Phone [phone]

## 2020-10-11 NOTE — Progress Notes (Signed)
Made Francis Dowse PA aware that pt's BP is elevated after rechecking multiple times. See flow sheet. New orders given. Emelda Brothers RN

## 2020-10-11 NOTE — Progress Notes (Signed)
Notified Rhonda Barrett PA about pt HR now 114-150 Afib, BP 172/127. New orders being placed. Emelda Brothers RN

## 2020-10-11 NOTE — Progress Notes (Addendum)
Post dose EKG is reviewed AFlutter 101bpm, QT is very difficult to assess in flutter, I will review with Dr. Johney Frame, do not anticipate change in dose 148/101, 156/92 manually Will further titrate her losartan  Francis Dowse, PA-C  ADDEND ECG is reviewed with Dr. Johney Frame, continue Tikosyn dose tonight Francis Dowse, PA-C

## 2020-10-11 NOTE — Progress Notes (Signed)
Pharmacy: Dofetilide (Tikosyn) - Follow Up Assessment and Electrolyte Replacement  Pharmacy consulted to assist in monitoring and replacing electrolytes in this 64 y.o. female admitted on 10/09/2020 undergoing dofetilide initiation. First dofetilide dose: 10/10/20.  Labs:    Component Value Date/Time   K 4.1 10/11/2020 0212   MG 2.0 10/11/2020 0212     Plan: Potassium: K >/= 4: No additional supplementation needed  Magnesium: Mg > 2: No additional supplementation needed   Thank you for allowing pharmacy to participate in this patient's care   Fredonia Highland, PharmD, BCPS Clinical Pharmacist [phone] Please check AMION for all [facility] numbers 10/11/2020

## 2020-10-11 NOTE — Progress Notes (Signed)
   10/11/20 1950  Assess: MEWS Score  Temp 98.3 F (36.8 C)  BP (!) 140/109  Pulse Rate (!) 128  ECG Heart Rate (!) 128  Resp 18  SpO2 98 %  O2 Device Room Air  Assess: MEWS Score  MEWS Temp 0  MEWS Systolic 0  MEWS Pulse 2  MEWS RR 0  MEWS LOC 0  MEWS Score 2  MEWS Score Color Yellow  Assess: if the MEWS score is Yellow or Red  Were vital signs taken at a resting state? Yes  Focused Assessment No change from prior assessment  Early Detection of Sepsis Score *See Row Information* Low  MEWS guidelines implemented *See Row Information* Yes  Treat  Pain Scale 0-10  Pain Score 0  Take Vital Signs  Increase Vital Sign Frequency  Yellow: Q 2hr X 2 then Q 4hr X 2, if remains yellow, continue Q 4hrs  Escalate  MEWS: Escalate Yellow: discuss with charge nurse/RN and consider discussing with provider and RRT  Notify: Charge Nurse/RN  Name of Charge Nurse/RN Notified Christy RN  Date Charge Nurse/RN Notified 10/11/20  Time Charge Nurse/RN Notified 1906  Document  Patient Outcome Other (Comment) (HR better with PRN medication see MAR)

## 2020-10-11 NOTE — Progress Notes (Signed)
   Pt BP still climbing, despite add'l losartan.   HR also increasing. No Dilt/BB po due to 9 sec pause w/ conversion. Pt had been on Toprol XL 200 mg qd pta.  Will add prn hydralazine and IV metoprolol 2.5 mg to help w/ BP/HR.  Do not want to convert pt, just a little rate control.   Continue to monitor.  Theodore Demark, PA-C 10/11/2020 7:12 PM

## 2020-10-11 NOTE — Progress Notes (Signed)
   10/11/20 1850  Assess: MEWS Score  BP (!) 172/127  ECG Heart Rate (!) 143  Assess: MEWS Score  MEWS Temp 0  MEWS Systolic 0  MEWS Pulse 3  MEWS RR 0  MEWS LOC 0  MEWS Score 3  MEWS Score Color Yellow  Assess: if the MEWS score is Yellow or Red  Were vital signs taken at a resting state? Yes  Focused Assessment No change from prior assessment  Early Detection of Sepsis Score *See Row Information* Low  MEWS guidelines implemented *See Row Information* Yes  Treat  MEWS Interventions Other (Comment) (Notified MD)  Patients Stated Pain Goal 0  Take Vital Signs  Increase Vital Sign Frequency  Yellow: Q 2hr X 2 then Q 4hr X 2, if remains yellow, continue Q 4hrs  Escalate  MEWS: Escalate Yellow: discuss with charge nurse/RN and consider discussing with provider and RRT  Notify: Charge Nurse/RN  Name of Charge Nurse/RN Notified Monica RN  Date Charge Nurse/RN Notified 10/11/20  Time Charge Nurse/RN Notified 1905  Notify: Provider  Provider Name/Title Theodore Demark PA  Date Provider Notified 10/11/20  Time Provider Notified [phone]  Notification Type Page  Notification Reason Change in status  Response See new orders  Date of Provider Response 10/11/20  Time of Provider Response 1905  Document  Patient Outcome Other (Comment) (stable, remains on unit)  Progress note created (see row info) Yes

## 2020-10-11 NOTE — Progress Notes (Addendum)
Progress Note  Patient Name: Anita Stanton Date of Encounter: 10/11/2020  [facility] Cardiologist: Jodelle Red, MD   Subjective   Feels good, thinks she tell she feels a bit better overall in SR.  No CP, palpitations, SOB.  She is concerned about her BP being high  Inpatient Medications    Scheduled Meds: . acidophilus  1 capsule Oral Daily  . apixaban  5 mg Oral BID  . vitamin C  1,000 mg Oral Daily  . dofetilide  500 mcg Oral BID  . furosemide  40 mg Oral Daily  . losartan  12.5 mg Oral Daily  . magnesium oxide  200 mg Oral Daily  . melatonin  2.5 mg Oral QHS  . sodium chloride flush  3 mL Intravenous Q12H   Continuous Infusions: . sodium chloride     PRN Meds: sodium chloride, sodium chloride flush   Vital Signs    Vitals:   10/10/20 1407 10/10/20 2140 10/11/20 0614 10/11/20 0619  BP: (!) 154/89 (!) 155/78 (!) 175/88   Pulse: (!) 55 (!) 59 67   Resp: 14 18 17    Temp:  98.1 F (36.7 C) 98.3 F (36.8 C)   TempSrc: Oral Oral Oral   SpO2: 100% 98% 95%   Weight:    97.8 kg  Height:        Intake/Output Summary (Last 24 hours) at 10/11/2020 0713 Last data filed at 10/11/2020 0615 Gross per 24 hour  Intake 123 ml  Output 1625 ml  Net -1502 ml   Last 3 Weights 10/11/2020 10/10/2020 10/09/2020  Weight (lbs) 215 lb 8 oz 215 lb 6.2 oz 213 lb 8 oz  Weight (kg) 97.75 kg 97.7 kg 96.843 kg      Telemetry    SB/SR, largely 50's-60 or so, some faster 70s this AM with ambulating in the room- Personally Reviewed  ECG    Post dose EKG SB 56bpm, manually measured QT 12/09/2020, QTc - Personally Reviewed   Pre-Tikosyn her last SR EKG 07/09/2020 is SB 51bpm, manually measured  440ms/QTc/QTc 443  Physical Exam   Exam is unchanged GEN: No acute distress.   Neck: No JVD Cardiac: RRR, no murmurs, rubs, or gallops.  Respiratory: CTA b/l. GI: Soft, nontender, non-distended  MS: No edema; No deformity. Neuro:  Nonfocal  Psych: Normal affect     Labs    High Sensitivity Troponin:  No results for input(s): TROPONINIHS in the last 720 hours.    Chemistry Recent Labs  Lab 10/09/20 1222 10/10/20 0615 10/11/20 0212  NA 141 142 139  K 4.8 4.6 4.1  CL 105 106 103  CO2 28 27 26   GLUCOSE 117* 125* 97  BUN 21 22 22   CREATININE 0.98 1.01* 0.96  CALCIUM 9.2 9.0 9.2  GFRNONAA >60 58* >60  ANIONGAP 8 9 10      HematologyNo results for input(s): WBC, RBC, HGB, HCT, MCV, MCH, MCHC, RDW, PLT in the last 168 hours.  BNPNo results for input(s): BNP, PROBNP in the last 168 hours.   DDimer No results for input(s): DDIMER in the last 168 hours.   Radiology    No results found.  Cardiac Studies   06/10/2020: TTE IMPRESSIONS  1. LVEF is severely depressed The proximal portion of LV contracts an  distal 2/3 is severely hypokinetic/akinetic . Left ventricular ejection  fraction, by estimation, is 25%%. The left ventricle has severely  decreased function. The left ventricle  demonstrates regional wall motion abnormalities (see scoring  diagram/findings for description). There is mild left ventricular  hypertrophy. Left ventricular diastolic parameters are indeterminate.  2. Proximal portion of RV contracts; mid/distal is akinetic/dyskinetic..  Right ventricular systolic function is severely reduced. The right  ventricular size is normal. There is mildly elevated pulmonary artery  systolic pressure.  3. Left atrial size was moderately dilated.  4. Right atrial size was moderately dilated.  5. The mitral valve is abnormal. Mild mitral valve regurgitation.  6. The aortic valve is abnormal. Aortic valve regurgitation is trivial.  Mild aortic valve sclerosis is present, with no evidence of aortic valve  stenosis.  7. The inferior vena cava is dilated in size with <50% respiratory  variability, suggesting right atrial pressure of 15 mmHg.    Patient Profile     64 y.o. female w/PMHx of HTN, obesity, recently found AFib and  new CM and CHF (suspcet 2/2 AFib, though pt declined cath for evaluation of coronaries).  She had ERAF after DCCV and is admitted for Tikosyn initiation.  Assessment & Plan    1. Persistent AFib     CHA2DS2Vasc is 4, on Eliquis, appropriately dosed     Tikosyn load is in progress      K+ 4.1      Mag 2.0      Creat 0.96, stable      QTc lengthened some after yesterday AM dose, looks stable after last PMD, I have reviewed with Dr. Johney Frame, we will reduce her dose to this AM  She had a long and symptomatic post conversion pause >> SB 48-50's and her diltiazem and metoprolol were held yesterday Rates are improved, mostly 50's-60's, some 70's with exertion Do not hold Tikosyn for bradycardia  2. HTN     Increase her losartan  3. CM, CHF      Exam is euvolemic      Anticipate improvement in LVEF with SR, given her bradycardia and long post termination pause, will not resume her BB   Anticipate d/c tomorrow if QT stable         For questions or updates, please contact [facility] Please consult [email or web address] for contact info under        Signed, Sheilah Pigeon, PA-C  10/11/2020, 7:13 AM    I have seen, examined the patient, and reviewed the above assessment and plan.  Changes to above are made where necessary.  On exam, iRRR.  Qt was a little long.  We have reduced tikosyn.  She is now back in afib. Will make npo for possible cardioversion tomorrow and follow closely.  Co Sign: Hillis Range, MD

## 2020-10-12 ENCOUNTER — Other Ambulatory Visit (HOSPITAL_COMMUNITY): Payer: Self-pay | Admitting: Internal Medicine

## 2020-10-12 DIAGNOSIS — I4819 Other persistent atrial fibrillation: Secondary | ICD-10-CM | POA: Diagnosis not present

## 2020-10-12 LAB — BASIC METABOLIC PANEL
Anion gap: 14 (ref 5–15)
BUN: 22 mg/dL (ref 8–23)
CO2: 22 mmol/L (ref 22–32)
Calcium: 9.5 mg/dL (ref 8.9–10.3)
Chloride: 102 mmol/L (ref 98–111)
Creatinine, Ser: 0.8 mg/dL (ref 0.44–1.00)
GFR, Estimated: >60 mL/min (ref >60)
Glucose, Bld: 112 mg/dL — ABNORMAL HIGH (ref 70–99)
Potassium: 3.6 mmol/L (ref 3.5–5.1)
Sodium: 138 mmol/L (ref 135–145)

## 2020-10-12 LAB — MAGNESIUM: Magnesium: 2.2 mg/dL (ref 1.7–2.4)

## 2020-10-12 SURGERY — CARDIOVERSION
Anesthesia: General

## 2020-10-12 MED ORDER — AMLODIPINE BESYLATE 5 MG PO TABS: 5.0000 mg | ORAL_TABLET | Freq: Every day | ORAL | Status: DC

## 2020-10-12 MED ORDER — LOSARTAN POTASSIUM 100 MG PO TABS: 100.0000 mg | ORAL_TABLET | Freq: Every day | ORAL | 6 refills | Status: DC

## 2020-10-12 MED ORDER — LOSARTAN POTASSIUM 50 MG PO TABS: 100.0000 mg | ORAL_TABLET | Freq: Every day | ORAL | Status: DC

## 2020-10-12 MED ORDER — DILTIAZEM HCL ER 60 MG PO CP12: 60.0000 mg | ORAL_CAPSULE | Freq: Two times a day (BID) | ORAL | 6 refills | Status: DC | PRN

## 2020-10-12 MED ORDER — DOFETILIDE 250 MCG PO CAPS: 250.0000 ug | ORAL_CAPSULE | Freq: Two times a day (BID) | ORAL | 6 refills | Status: DC

## 2020-10-12 MED ORDER — LOSARTAN POTASSIUM 100 MG PO TABS
100.0000 mg | ORAL_TABLET | Freq: Every day | ORAL | 6 refills | Status: DC
Start: 2020-10-13 — End: 2020-11-13

## 2020-10-12 MED ORDER — DILTIAZEM HCL 30 MG PO TABS: 30.0000 mg | ORAL_TABLET | Freq: Two times a day (BID) | ORAL | 6 refills | Status: DC | PRN

## 2020-10-12 MED ORDER — POTASSIUM CHLORIDE CRYS ER 20 MEQ PO TBCR
40.0000 meq | EXTENDED_RELEASE_TABLET | Freq: Once | ORAL | Status: AC
  Administered 2020-10-12: 40 meq via ORAL
  Filled 2020-10-12: qty 2

## 2020-10-12 MED ORDER — LOSARTAN POTASSIUM 50 MG PO TABS
100.0000 mg | ORAL_TABLET | Freq: Every day | ORAL | Status: DC
  Administered 2020-10-12: 100 mg via ORAL
  Filled 2020-10-12: qty 2

## 2020-10-12 MED FILL — LOSARTAN POTASSIUM 100 MG T: 100 | 30 days supply | Qty: 30 | Fill #0

## 2020-10-12 MED FILL — dilTIAZem HCL 30 MG TABS: 30 | 30 days supply | Qty: 60 | Fill #0

## 2020-10-12 MED FILL — DOFETILIDE 250 MCG CAPS: 250 | 30 days supply | Qty: 60 | Fill #0

## 2020-10-12 NOTE — Progress Notes (Addendum)
Progress Note  Patient Name: Anita Stanton Date of Encounter: 10/12/2020  [facility] Cardiologist: Jodelle Red, MD   Subjective   Feels good, vaguely aware of palpitations,   Inpatient Medications    Scheduled Meds:  acidophilus  1 capsule Oral Daily   amLODipine  5 mg Oral Daily   apixaban  5 mg Oral BID   vitamin C  1,000 mg Oral Daily   dofetilide  250 mcg Oral BID   furosemide  40 mg Oral Daily   losartan  50 mg Oral Daily   magnesium oxide  200 mg Oral Daily   melatonin  2.5 mg Oral QHS   potassium chloride  40 mEq Oral Once   sodium chloride flush  3 mL Intravenous Q12H   Continuous Infusions:  sodium chloride     PRN Meds: sodium chloride, hydrALAZINE, metoprolol tartrate, sodium chloride flush   Vital Signs    Vitals:   10/11/20 1950 10/11/20 2138 10/12/20 0029 10/12/20 0604  BP: (!) 140/109 (!) 150/107 (!) 156/126 (!) 163/114  Pulse: (!) 128 (!) 114 (!) 115 (!) 115  Resp: 18 18 16 18   Temp: 98.3 F (36.8 C) 98.4 F (36.9 C) 98.3 F (36.8 C) 98.3 F (36.8 C)  TempSrc: Oral Oral Oral Oral  SpO2: 98% 95% 98% 99%  Weight:    95.5 kg  Height:        Intake/Output Summary (Last 24 hours) at 10/12/2020 0711 Last data filed at 10/12/2020 10/14/2020 Gross per 24 hour  Intake 420 ml  Output 1350 ml  Net -930 ml   Last 3 Weights 10/12/2020 10/11/2020 10/10/2020  Weight (lbs) 210 lb 9.6 oz 215 lb 8 oz 215 lb 6.2 oz  Weight (kg) 95.528 kg 97.75 kg 97.7 kg      Telemetry    AFib 130's, she did have about 15 min of AF this AM- Personally Reviewed  ECG    Post dose EKG course AFib/atypical AFlutter 113bpm, QT is very difficult to assess - Personally Reviewed   Pre-Tikosyn her last SR EKG 07/09/2020 is SB 51bpm, manually measured  451ms/QTc/QTc 443  Physical Exam    GEN: No acute distress.   Neck: No JVD Cardiac: irreg-irreg, tachycardic, no murmurs, rubs, or gallops.  Respiratory: CTA b/l. GI: Soft, nontender, non-distended    MS: No edema; No deformity. Neuro:  Nonfocal  Psych: Normal affect   Labs    High Sensitivity Troponin:  No results for input(s): TROPONINIHS in the last 720 hours.    Chemistry Recent Labs  Lab 10/10/20 0615 10/11/20 0212 10/12/20 0245  NA 142 139 138  K 4.6 4.1 3.6  CL 106 103 102  CO2 27 26 22   GLUCOSE 125* 97 112*  BUN 22 22 22   CREATININE 1.01* 0.96 0.80  CALCIUM 9.0 9.2 9.5  GFRNONAA 58* >60 >60  ANIONGAP 9 10 14      HematologyNo results for input(s): WBC, RBC, HGB, HCT, MCV, MCH, MCHC, RDW, PLT in the last 168 hours.  BNPNo results for input(s): BNP, PROBNP in the last 168 hours.   DDimer No results for input(s): DDIMER in the last 168 hours.   Radiology    No results found.  Cardiac Studies   06/10/2020: TTE IMPRESSIONS  1. LVEF is severely depressed The proximal portion of LV contracts an  distal 2/3 is severely hypokinetic/akinetic . Left ventricular ejection  fraction, by estimation, is 25%%. The left ventricle has severely  decreased function. The left ventricle  demonstrates regional wall motion abnormalities (see scoring  diagram/findings for description). There is mild left ventricular  hypertrophy. Left ventricular diastolic parameters are indeterminate.   2. Proximal portion of RV contracts; mid/distal is akinetic/dyskinetic..  Right ventricular systolic function is severely reduced. The right  ventricular size is normal. There is mildly elevated pulmonary artery  systolic pressure.   3. Left atrial size was moderately dilated.   4. Right atrial size was moderately dilated.   5. The mitral valve is abnormal. Mild mitral valve regurgitation.   6. The aortic valve is abnormal. Aortic valve regurgitation is trivial.  Mild aortic valve sclerosis is present, with no evidence of aortic valve  stenosis.   7. The inferior vena cava is dilated in size with <50% respiratory  variability, suggesting right atrial pressure of 15 mmHg.    Patient  Profile     64 y.o. female w/PMHx of HTN, obesity, recently found AFib and new CM and CHF (suspcet 2/2 AFib, though pt declined cath for evaluation of coronaries).  She had ERAF after DCCV and is admitted for Tikosyn initiation.  Assessment & Plan    1. Persistent AFib     CHA2DS2Vasc is 4, on Eliquis, appropriately dosed     Tikosyn load is in progress      K+ 3.6 replaced      Mag 2.2      Creat 0.80, stable      QTc lengthened some requiring reduction in dose  She had a long and symptomatic post conversion pause after dose #1 >> SB 48-50's and her diltiazem and metoprolol were held  Sinus rates improved, mostly 50's-60's, some 70's with exertion  Yesterday morning reverted back to a course AFib, rates 100's-130's This morning she was off the monitor briefly for bathing and when placed back on was in SR 70's, lasting only about . Unable to get an EKG prior to her reverting back into aflutter. QTc by telemetry looked good, QT440ms, and QTc 480  Do not hold Tikosyn for bradycardia  D/w Dr. Johney Frame, rates this AM 120's-150's, mostly 120's-130's, Duaa Stelzner still plan to discharge if QT allows, and use short acting dilt for HR PRN, not routinely daily at least for now  Dr. Elberta Fortis has seen the patient this AM  2. HTN     Increase her losartan further today, given low EF, try to avoid the CCB  3. CM, CHF      Exam is euvolemic      Anticipate improvement in LVEF with SR, given her bradycardia and long post termination pause, Bernardina Cacho not resume her BB       Discussed Entresto yesterday though Ahilyn Nell make no further changes to her HF management for now.          For questions or updates, please contact [facility] Please consult [email or web address] for contact info under        Signed, Sheilah Pigeon, PA-C  10/12/2020, 7:11 AM    I have seen and examined this patient with Francis Dowse.  Agree with above, note added to reflect my findings.  On exam, irregular, no murmurs,  tachycardic.  Patient with continued atrial fibrillation.  She is converted to atrial flutter on her ECG today.  She has had some sinus rhythm while mostly in atrial fibrillation despite dofetilide load.  We Rosine Solecki plan to discharge her on dofetilide with follow-up in A. fib clinic.  Amenda Duclos M. Danyelle Brookover MD 10/12/2020 11:13 AM

## 2020-10-12 NOTE — Progress Notes (Signed)
Pharmacy: Dofetilide (Tikosyn) - Follow Up Assessment and Electrolyte Replacement  Pharmacy consulted to assist in monitoring and replacing electrolytes in this 64 y.o. female admitted on 10/09/2020 undergoing dofetilide initiation. First dofetilide dose: 10/10/20. Remains in Afib RVR   Labs:    Component Value Date/Time   K 3.6 10/12/2020 0245   MG 2.2 10/12/2020 0245     Plan: Potassium: K 3.6 repalced with KCL x1  Recheck in am   Magnesium: Mg > 2: No additional supplementation needed   Leota Sauers Pharm.D. CPP, BCPS Clinical Pharmacist [phone] 10/12/2020 7:42 AM

## 2020-10-12 NOTE — Care Management (Signed)
1015 10-12-20 Case Manager spoke with patient and she would like her Tikosyn refills to go to [facility] [address] Gala Lewandowsky, RN,BSN Case Manager

## 2020-10-12 NOTE — Discharge Summary (Signed)
ELECTROPHYSIOLOGY PROCEDURE DISCHARGE SUMMARY    Patient ID: Anita Stanton,  MRN: [id number], DOB/AGE: 66-Nov-1957 66 y.o.  Admit date: 10/09/2020 Discharge date: 10/12/2020  Primary Care Physician: System, Provider Not In  Primary Cardiologist: Dr. Cristal Deer Electrophysiologist: Dr. Johney Frame  Primary Discharge Diagnosis:  1.  persistent atrial fibrillation status post Tikosyn loading this admission      CHA2DS2Vasc is 4, on Eliquis, appropriately dosed  Secondary Discharge Diagnosis:  1. CM,      suspect AF/tachy-mediated 2. CHF     Compensated currently 3. HTN 4. Obesity   No Known Allergies   Procedures This Admission:  1.  Tikosyn loading    Brief HPI: Anita Stanton is a 64 y.o. female with a past medical history as noted above.  They were referred to EP in the outpatient setting for treatment options of atrial fibrillation.  Risks, benefits, and alternatives to Tikosyn were reviewed with the patient who wished to proceed.    Hospital Course:  The patient was admitted and Tikosyn was initiated.  Renal function and electrolytes were followed during the hospitalization.  The patient's QTc lengthened requiring reduction in dose.  She converted to SB with the first dose of drug with a 9.6second, symptomatic post termination pause to SB 48-50's and her betablocker and CCB were stopped.  She reverted back to a course Afib, rates 120's-130's, some slower and faster.  Today, she had about 15 minutes of SR 70's, she was off monitor to bathe and when placed back on was in SR, she had no symptoms to suggest any significant pause or slowing with conversion.  Post dose this morning, she has had 2 times with 2 sinus beats, post conversion 2.4seconds and without symptoms.  Given some SR this morning,  in d/w Dr. Johney Frame, planned to pursue ongoing Tikosyn with plans to use diltiazem 30mg  BID PRN for sustained HR >110 at home.  Her BP was elevated here and her losartan  titrated upwards.  Her most recent BP prior to discharge is 148/85.  Her HR while I am with her giving discharge instructions, her HR is 105-110, this AM 105-130.  She feels well, no active awareness of her Afib, no CP or SOB, post dose EKG was reviewed with Dr. , remained in Aflutter, she was examined by Dr Elberta Fortis who considered the patient stable for discharge to home.  Follow-up has been arranged with the AFib clinic in 1 week and with myself in 4 weeks.   Tikosyn teaching was completed.  At this time her 1st fill will be through [facility] pharmacy and [facility],  She has decided to talk with her husband more about the good Rx program and research that more, and for now [facility].  She only required potassium supplementation once and no additional electrolyte replacement for home is felt needed.  Physical Exam: Vitals:   10/12/20 0029 10/12/20 0604 10/12/20 0732 10/12/20 1033  BP: (!) 156/126 (!) 163/114 (!) 171/140   Pulse: (!) 115 (!) 115 (!) 110 (!) 124  Resp: 16 18 20 18   Temp: 98.3 F (36.8 C) 98.3 F (36.8 C) 98.1 F (36.7 C) 98.1 F (36.7 C)  TempSrc: Oral Oral Oral Oral  SpO2: 98% 99%    Weight:  95.5 kg    Height:         GEN- The patient is well appearing, alert and oriented x 3  today.   HEENT: normocephalic, atraumatic; sclera clear, conjunctiva pink; hearing intact; oropharynx clear; neck supple, no JVP Lymph- no cervical lymphadenopathy Lungs- CTA b/l, normal work of breathing.  No wheezes, rales, rhonchi Heart- irreg-irreg, no murmurs, rubs or gallops, PMI not laterally displaced GI- soft, non-tender, non-distended Extremities- no clubbing, cyanosis, or edema MS- no significant deformity or atrophy Skin- warm and dry, no rash or lesion Psych- euthymic mood, full affect Neuro- strength and sensation are intact   Labs:   Lab Results  Component Value Date   WBC 8.7 07/06/2020   HGB  13.0 07/06/2020   HCT 40.6 07/06/2020   MCV 87 07/06/2020   PLT 271 07/06/2020    Recent Labs  Lab 10/12/20 0245  NA 138  K 3.6  CL 102  CO2 22  BUN 22  CREATININE 0.80  CALCIUM 9.5  GLUCOSE 112*     Discharge Medications:  Allergies as of 10/12/2020   No Known Allergies     Medication List    STOP taking these medications   diltiazem 60 MG 12 hr capsule Commonly known as: CARDIZEM SR Replaced by: diltiazem 30 MG tablet   metoprolol 200 MG 24 hr tablet Commonly known as: TOPROL-XL   ZINC PO     TAKE these medications   ALPHA LIPOIC ACID PO Take 600 mg by mouth daily.   apixaban 5 MG Tabs tablet Commonly known as: ELIQUIS Take 1 tablet (5 mg total) by mouth 2 (two) times daily.   CoQ10 100 MG Caps Take 100 mg by mouth daily.   D-Ribose Powd Take 5 capsules by mouth daily.   diltiazem 30 MG tablet Commonly known as: Cardizem Take 1 tablet (30 mg total) by mouth 2 (two) times daily as needed (for heart rate persistently greater then 110). Replaces: diltiazem 60 MG 12 hr capsule   dofetilide 250 MCG capsule Commonly known as: TIKOSYN Take 1 capsule (250 mcg total) by mouth 2 (two) times daily.   furosemide 40 MG tablet Commonly known as: LASIX TAKE 1 TAB (40 MG TOTAL) BY MOUTH DAILY What changed: See the new instructions.   losartan 100 MG tablet Commonly known as: COZAAR Take 1 tablet (100 mg total) by mouth daily. Start taking on: October 13, 2020 What changed:   medication strength  how much to take   Magnesium 100 MG Tabs Take 100 mg by mouth at bedtime.   melatonin 1 MG Tabs tablet Take 1 mg by mouth at bedtime. What changed: Another medication with the same name was removed. Continue taking this medication, and follow the directions you see here.   OVER THE COUNTER MEDICATION Apply 1 application topically 2 (two) times a week. Vit D3 cream/anumed/vitamin K- Apply 1 squirt (5,000 units) to the skin once a day in the Summer and 1  squirt (5,000 units) two times a day in the Winter   Probiotic Acidophilus Caps Take 1 capsule by mouth every 12 (twelve) hours.   vitamin C 1000 MG tablet Take 1,000 mg by mouth daily.   VITAMIN-B COMPLEX PO Take 1 tablet by mouth daily.   ZINC GLUCONATE PO Take 10 mg by mouth every evening.       Disposition: Home Discharge Instructions    Diet - low sodium heart healthy   Complete by: As directed    Increase activity slowly   Complete by: As directed       Follow-up Information    [facility] Follow up.  Specialty: Cardiology Why: 10/19/2020 @ 10:30AM with R. Charlean Merl, [place] Contact information: [address] [id number] mc [place] [address] [phone]       Sheilah Pigeon, PA-C Follow up.   Specialty: Cardiology Why: 11/12/2020 @ 11:45AM Contact information: [address] [place] [address] [phone]               Duration of Discharge Encounter: Greater than 30 minutes including physician time.  Norma Fredrickson, PA-C 10/12/2020 11:53 AM

## 2020-10-14 ENCOUNTER — Encounter: Payer: Self-pay | Admitting: Cardiology

## 2020-10-18 NOTE — Progress Notes (Signed)
Primary Care Physician: System, Provider Not In Primary Cardiologist: Dr Cristal Deer Primary Electrophysiologist: Dr Johney Frame Referring Physician: Dr Johney Frame    Anita Stanton is a 64 y.o. female with a history of persistent atrial fibrillation, chronic systolic dysfunction, HTN who presents to the [facility] for follow up. She was initially diagnosed with afib 06/09/20 after presenting with AF with RVR and decompensated CHF (EF 25%). She was started on eliquis and toprol. She was cardioverted 07/09/20. Unfortunately, she returned to afib several days later. She was seen by Dr Johney Frame who recommended dofetilide admission. Patient reports she wanted to consider her options before making a decision. Patient is on Eliquis for a CHADS2VASC score of 4.  On follow up today, patient is s/p dofetilide admission 10/12-10/15/21. She converted to SR after the first dose and had a 9 second post termination pause. Her BB and CCB were stopped. Her QT lengthened and her dofetilide dose was decreased. Unfortunately, she reverted back to atrial flutter. Patient reports that she converted to SR yesterday on her Kardia mobile.  Today, she denies symptoms of palpitations, chest pain, shortness of breath, orthopnea, PND, lower extremity edema, dizziness, presyncope, syncope, bleeding, or neurologic sequela. The patient is tolerating medications without difficulties and is otherwise without complaint today.    Atrial Fibrillation Risk Factors:  she does have symptoms or diagnosis of sleep apnea. Patient declined sleep study. she does not have a history of rheumatic fever.   she has a BMI of Body mass index is 39.07 kg/m.Marland Kitchen Filed Weights   10/19/20 1045  Weight: 96.9 kg    Family History  Problem Relation Age of Onset  . Atrial fibrillation Mother   . Cancer Mother      Atrial Fibrillation Management history:  Previous antiarrhythmic drugs: dofetilide Previous  cardioversions: 07/09/20 Previous ablations: none CHADS2VASC score: 4 Anticoagulation history: Eliquis   Past Medical History:  Diagnosis Date  . Atrial fibrillation (HCC) 10/09/2020  . Cardiomyopathy (HCC)   . Hypertension   . Obesity   . Persistent atrial fibrillation [facility])    Past Surgical History:  Procedure Laterality Date  . CARDIOVERSION N/A 07/09/2020   Procedure: CARDIOVERSION;  Surgeon: Pricilla Riffle, MD;  Location: [facility] ENDOSCOPY;  Service: Cardiovascular;  Laterality: N/A;  . TONSILECTOMY, ADENOIDECTOMY, BILATERAL MYRINGOTOMY AND TUBES N/A     Current Outpatient Medications  Medication Sig Dispense Refill  . ALPHA LIPOIC ACID PO Take 600 mg by mouth daily.     Marland Kitchen apixaban (ELIQUIS) 5 MG TABS tablet Take 1 tablet (5 mg total) by mouth 2 (two) times daily. 60 tablet 11  . Ascorbic Acid (VITAMIN C) 1000 MG tablet Take 1,000 mg by mouth daily.    . B Complex Vitamins (VITAMIN-B COMPLEX PO) Take 1 tablet by mouth daily.    . Coenzyme Q10 (COQ10) 100 MG CAPS Take 100 mg by mouth daily.    Marland Kitchen D-Ribose POWD Take 5 capsules by mouth daily.     Marland Kitchen diltiazem (CARDIZEM) 30 MG tablet Take 1 tablet (30 mg total) by mouth 2 (two) times daily as needed (for heart rate persistently greater then 110). 60 tablet 6  . dofetilide (TIKOSYN) 250 MCG capsule Take 1 capsule (250 mcg total) by mouth 2 (two) times daily. 60 capsule 6  . furosemide (LASIX) 40 MG tablet TAKE 1 TAB (40 MG TOTAL) BY MOUTH DAILY (Patient taking differently: Take 40 mg by mouth daily. ) 90 tablet 3  . Lactobacillus (PROBIOTIC ACIDOPHILUS) CAPS  Take 1 capsule by mouth every 12 (twelve) hours. Sometimes takes liquid form    . losartan (COZAAR) 100 MG tablet Take 1 tablet (100 mg total) by mouth daily. 30 tablet 6  . Magnesium 100 MG TABS Take 100 mg by mouth at bedtime.     . melatonin 1 MG TABS tablet Take 1 mg by mouth at bedtime.    Marland Kitchen OVER THE COUNTER MEDICATION Apply 1 application topically 2 (two) times a week. Vit D3  cream/anumed/vitamin K- Apply 1 squirt (5,000 units) to the skin once a day in the Summer and 1 squirt (5,000 units) two times a day in the Winter    . ZINC GLUCONATE PO Take 10 mg by mouth every evening.     No current facility-administered medications for this encounter.    No Known Allergies  Social History   Socioeconomic History  . Marital status: Married    Spouse name: Not on file  . Number of children: Not on file  . Years of education: Not on file  . Highest education level: Not on file  Occupational History  . Not on file  Tobacco Use  . Smoking status: Never Smoker  . Smokeless tobacco: Never Used  Vaping Use  . Vaping Use: Never used  Substance and Sexual Activity  . Alcohol use: Not Currently    Comment: rare beer on occasion  . Drug use: Never  . Sexual activity: Not on file  Other Topics Concern  . Not on file  Social History Narrative   Lives in [place],  Recently retired IT consultant   Social Determinants of Corporate investment banker Strain:   . Difficulty of Paying Living Expenses: Not on file  Food Insecurity:   . Worried About Programme researcher, broadcasting/film/video in the Last Year: Not on file  . Ran Out of Food in the Last Year: Not on file  Transportation Needs:   . Lack of Transportation (Medical): Not on file  . Lack of Transportation (Non-Medical): Not on file  Physical Activity:   . Days of Exercise per Week: Not on file  . Minutes of Exercise per Session: Not on file  Stress:   . Feeling of Stress : Not on file  Social Connections:   . Frequency of Communication with Friends and Family: Not on file  . Frequency of Social Gatherings with Friends and Family: Not on file  . Attends Religious Services: Not on file  . Active Member of Clubs or Organizations: Not on file  . Attends Banker Meetings: Not on file  . Marital Status: Not on file  Intimate Partner Violence:   . Fear of Current or Ex-Partner: Not on file  . Emotionally Abused: Not  on file  . Physically Abused: Not on file  . Sexually Abused: Not on file     ROS- All systems are reviewed and negative except as per the HPI above.  Physical Exam: Vitals:   10/19/20 1045  BP: (!) 160/106  Pulse: 73  Weight: 96.9 kg  Height: 5\' 2"  (1.575 m)    GEN- The patient is well appearing obese female, alert and oriented x 3 today.   HEENT-head normocephalic, atraumatic, sclera clear, conjunctiva pink, hearing intact, trachea midline. Lungs- Clear to ausculation bilaterally, normal work of breathing Heart- Regular rate and rhythm, no murmurs, rubs or gallops  GI- soft, NT, ND, + BS Extremities- no clubbing, cyanosis, or edema MS- no significant deformity or atrophy Skin- no rash or  lesion Psych- euthymic mood, full affect Neuro- strength and sensation are intact   Wt Readings from Last 3 Encounters:  10/19/20 96.9 kg  10/12/20 95.5 kg  10/09/20 96.5 kg    EKG today demonstrates SR HR 73, PR 198, QRS 84, QTc 484  Echo 06/10/20 demonstrated  1. LVEF is severely depressed The proximal portion of LV contracts an  distal 2/3 is severely hypokinetic/akinetic . Left ventricular ejection  fraction, by estimation, is 25%%. The left ventricle has severely  decreased function. The left ventricle  demonstrates regional wall motion abnormalities (see scoring  diagram/findings for description). There is mild left ventricular  hypertrophy. Left ventricular diastolic parameters are indeterminate.  2. Proximal portion of RV contracts; mid/distal is akinetic/dyskinetic..  Right ventricular systolic function is severely reduced. The right  ventricular size is normal. There is mildly elevated pulmonary artery  systolic pressure.  3. Left atrial size was moderately dilated.  4. Right atrial size was moderately dilated.  5. The mitral valve is abnormal. Mild mitral valve regurgitation.  6. The aortic valve is abnormal. Aortic valve regurgitation is trivial.  Mild aortic  valve sclerosis is present, with no evidence of aortic valve  stenosis.  7. The inferior vena cava is dilated in size with <50% respiratory  variability, suggesting right atrial pressure of 15 mmHg.   Epic records are reviewed at length today  CHA2DS2-VASc Score = 4  The patient's score is based upon: CHF History: 1 HTN History: 1 Diabetes History: 0 Stroke History: 0 Vascular Disease History: 0      ASSESSMENT AND PLAN: 1. Persistent Atrial Fibrillation/atrial flutter The patient's CHA2DS2-VASc score is 4, indicating a 4.8% annual risk of stroke.   S/p dofetilide loading 10/12-10/15/21 Patient converted to SR on medication.  Continue Eliquis 5 mg BID. QT stable. Check bmet/mag today.  2. Secondary Hypercoagulable State (ICD10:  D68.69) The patient is at significant risk for stroke/thromboembolism based upon her CHA2DS2-VASc Score of 4.  Continue Apixaban (Eliquis).   3. Obesity Body mass index is 39.07 kg/m. Lifestyle modification was discussed and encouraged including regular physical activity and weight reduction.  4. Chronic systolic CHF EF 25% by echo Possibly related to afib. Patient refused LHC. No signs or symptoms of fluid overload.  Repeat echo scheduled.   5. HTN Elevated today, her losartan was just increased during her admission. She reports good BP control on her home monitor. No changes today.   Follow up with Francis Dowse and Dr Cristal Deer as scheduled.    Jorja Loa PA-C Afib Clinic [facility] [address] [place], [place] [address] [phone] 10/19/2020 11:30 AM

## 2020-10-19 ENCOUNTER — Other Ambulatory Visit: Payer: Self-pay

## 2020-10-19 ENCOUNTER — Ambulatory Visit (HOSPITAL_COMMUNITY)
Admission: RE | Admit: 2020-10-19 | Discharge: 2020-10-19 | Disposition: A | Discharge status: Home / Self Care | Payer: Medicare Other | Source: Ambulatory Visit | Attending: Physician Assistant | Admitting: Physician Assistant

## 2020-10-19 VITALS — BP 160/106 | HR 73 | Ht 62.0 in | Wt 213.6 lb

## 2020-10-19 DIAGNOSIS — I4819 Other persistent atrial fibrillation: Secondary | ICD-10-CM | POA: Diagnosis present

## 2020-10-19 DIAGNOSIS — Z79899 Other long term (current) drug therapy: Secondary | ICD-10-CM | POA: Diagnosis not present

## 2020-10-19 DIAGNOSIS — I4892 Unspecified atrial flutter: Secondary | ICD-10-CM | POA: Insufficient documentation

## 2020-10-19 DIAGNOSIS — I110 Hypertensive heart disease with heart failure: Secondary | ICD-10-CM | POA: Diagnosis not present

## 2020-10-19 DIAGNOSIS — Z7901 Long term (current) use of anticoagulants: Secondary | ICD-10-CM | POA: Diagnosis not present

## 2020-10-19 DIAGNOSIS — I5022 Chronic systolic (congestive) heart failure: Secondary | ICD-10-CM | POA: Insufficient documentation

## 2020-10-19 DIAGNOSIS — D6869 Other thrombophilia: Secondary | ICD-10-CM | POA: Insufficient documentation

## 2020-10-19 DIAGNOSIS — E669 Obesity, unspecified: Secondary | ICD-10-CM | POA: Insufficient documentation

## 2020-10-19 DIAGNOSIS — Z6839 Body mass index (BMI) 39.0-39.9, adult: Secondary | ICD-10-CM | POA: Insufficient documentation

## 2020-10-19 LAB — BASIC METABOLIC PANEL
Anion gap: 9 (ref 5–15)
BUN: 17 mg/dL (ref 8–23)
CO2: 27 mmol/L (ref 22–32)
Calcium: 9.5 mg/dL (ref 8.9–10.3)
Chloride: 104 mmol/L (ref 98–111)
Creatinine, Ser: 0.81 mg/dL (ref 0.44–1.00)
GFR, Estimated: >60 mL/min (ref >60)
Glucose, Bld: 113 mg/dL — ABNORMAL HIGH (ref 70–99)
Potassium: 4.8 mmol/L (ref 3.5–5.1)
Sodium: 140 mmol/L (ref 135–145)

## 2020-10-19 LAB — MAGNESIUM: Magnesium: 2.2 mg/dL (ref 1.7–2.4)

## 2020-10-23 ENCOUNTER — Telehealth: Payer: Self-pay | Admitting: Cardiology

## 2020-10-23 NOTE — Telephone Encounter (Signed)
Patient states that she goes between NSR and Afib quite often but she was recently hospitalized for Tikosyn therapy. She left the hospital in NSR and her discharge instructions decreased her usual Cardizem dose of 60 mg daily to 30 mg daily.   Patient states that she has now been in Afib consistently for more than 24 hours with her HR typically around 130 bpm. Patient denies palpitations or SOB but states she can feel that her heart is beating much faster than normal.   She is wondering if she needs to go back to her original dose of 60 mg daily Cardizem.  Will route to [facility], MD and RN for advisement.

## 2020-10-23 NOTE — Telephone Encounter (Signed)
Pt advised on MD's recommendation an voiced she would like to just try increasing Cardizem to 60 mg for a couple days first before scheduling an appointment with Afib. Pt state she will call back with an update.

## 2020-10-23 NOTE — Telephone Encounter (Signed)
Called the patient back regarding her Atrial Fibrillation and left a message to call the offic back when convenient.

## 2020-10-23 NOTE — Telephone Encounter (Signed)
On review of her recent admission, she had both the long acting metoprolol and diltiazem stopped and instructed to use only PRN dilitazem due to post conversion pauses. I'd prefer to not use the long acting diltiazem long term, so let's have her use just the diltiazem short acting up to 60 mg daily and follow up in afib clinic.

## 2020-10-23 NOTE — Telephone Encounter (Signed)
I would return to 60 mg daily for elevated heart rates and set her up to be seen in afib clinic. Thanks.

## 2020-10-23 NOTE — Telephone Encounter (Signed)
Patient returning phone call will await a call back.

## 2020-10-23 NOTE — Telephone Encounter (Signed)
Pt advised of MD's recommendation and voiced she has already taking 60 mg of diltiazem roughly 30 mins. Pt report HR stills remains elevated but understand it can take an hour or so for medication to work. Pt voiced she will continue to monitor HR and if it remains elevated, she will contact Afib clinic for appointment.

## 2020-10-23 NOTE — Telephone Encounter (Signed)
Patient c/o Palpitations:  High priority if patient c/o lightheadedness, shortness of breath, or chest pain  1) How long have you had palpitations/irregular HR/ Afib? Are you having the symptoms now?  Aifib started 25th, High 130 and High 140s   2) Are you currently experiencing lightheadedness, SOB or CP? No Symptoms   3) Do you have a history of afib (atrial fibrillation) or irregular heart rhythm? Yes   4) Have you checked your BP or HR? (document readings if available):  Heart rate was 137 this morning     5) Are you experiencing any other symptoms? No   Pt stated that her diltiazem (CARDIZEM) 30 MG tablet [[id number]] was decreased from 60mg   to 30mg  in the hosp.   She would like to know if she needs to take a 60mg  this morning?  She stated it seems like she has been in Afib since it was decreased?    Best number [phone]

## 2020-10-24 ENCOUNTER — Telehealth: Payer: Self-pay | Admitting: Internal Medicine

## 2020-10-24 NOTE — Telephone Encounter (Addendum)
Per Jorja Loa PA will need to come into the office to further assess possible medication changes. Pt hesitant to agree to appt but finally agreed to come tomorrow.

## 2020-10-24 NOTE — Telephone Encounter (Signed)
     Pt c/o medication issue:  1. Name of Medication:   diltiazem (CARDIZEM) 30 MG tablet    2. How are you currently taking this medication (dosage and times per day)? Take 1 tablet (30 mg total) by mouth 2 (two) times daily as needed (for heart rate persistently greater then 110).  3. Are you having a reaction (difficulty breathing--STAT)?   4. What is your medication issue? Pt called yesterday about her issue with her Afib, she said she was in constant afib for 24 hours and checking if she can adjust her diltiazem, Dr. Cristal Deer gave her recommendation but she wanted to get Dr. Jenel Lucks or his APP. She wanted to speak with [facility] or Dr. Johney Frame

## 2020-10-25 ENCOUNTER — Other Ambulatory Visit: Payer: Self-pay

## 2020-10-25 ENCOUNTER — Encounter (HOSPITAL_COMMUNITY): Payer: Self-pay | Admitting: Physician Assistant

## 2020-10-25 ENCOUNTER — Ambulatory Visit (HOSPITAL_COMMUNITY)
Admission: RE | Admit: 2020-10-25 | Discharge: 2020-10-25 | Disposition: A | Discharge status: Home / Self Care | Payer: Medicare Other | Source: Ambulatory Visit | Attending: Physician Assistant | Admitting: Physician Assistant

## 2020-10-25 VITALS — BP 138/82 | HR 81 | Ht 62.0 in | Wt 214.0 lb

## 2020-10-25 DIAGNOSIS — Z79899 Other long term (current) drug therapy: Secondary | ICD-10-CM | POA: Diagnosis not present

## 2020-10-25 DIAGNOSIS — I110 Hypertensive heart disease with heart failure: Secondary | ICD-10-CM | POA: Diagnosis not present

## 2020-10-25 DIAGNOSIS — I4819 Other persistent atrial fibrillation: Secondary | ICD-10-CM | POA: Insufficient documentation

## 2020-10-25 DIAGNOSIS — D6869 Other thrombophilia: Secondary | ICD-10-CM

## 2020-10-25 DIAGNOSIS — Z7901 Long term (current) use of anticoagulants: Secondary | ICD-10-CM | POA: Diagnosis not present

## 2020-10-25 DIAGNOSIS — I5022 Chronic systolic (congestive) heart failure: Secondary | ICD-10-CM | POA: Diagnosis not present

## 2020-10-25 DIAGNOSIS — Z6839 Body mass index (BMI) 39.0-39.9, adult: Secondary | ICD-10-CM | POA: Insufficient documentation

## 2020-10-25 DIAGNOSIS — E669 Obesity, unspecified: Secondary | ICD-10-CM | POA: Diagnosis not present

## 2020-10-25 MED ORDER — DOFETILIDE 125 MCG PO CAPS
125.0000 ug | ORAL_CAPSULE | Freq: Two times a day (BID) | ORAL | 1 refills | Status: DC
Start: 2020-10-25 — End: 2020-12-24

## 2020-10-25 NOTE — Progress Notes (Signed)
Primary Care Physician: System, Provider Not In Primary Cardiologist: Dr Cristal Deer Primary Electrophysiologist: Dr Johney Frame Referring Physician: Dr Johney Frame    Anita Stanton is a 64 y.o. female with a history of persistent atrial fibrillation, chronic systolic dysfunction, HTN who presents to the [facility] for follow up. She was initially diagnosed with afib 06/09/20 after presenting with AF with RVR and decompensated CHF (EF 25%). She was started on eliquis and toprol. She was cardioverted 07/09/20. Unfortunately, she returned to afib several days later. She was seen by Dr Johney Frame who recommended dofetilide admission. Patient reports she wanted to consider her options before making a decision. Patient is on Eliquis for a CHADS2VASC score of 4.  On follow up today, patient is s/p dofetilide admission 10/12-10/15/21. She converted to SR after the first dose and had a 9 second post termination pause. Her BB and CCB were stopped. Her QT lengthened and her dofetilide dose was decreased. Unfortunately, she reverted back to atrial flutter.   On follow up today, patient has continued to go in and out of afib. She brings in her Lourena Simmonds strips which show she has had rapid afib at least once daily since leaving the hospital. She is in SR today.   Today, she denies symptoms of chest pain, shortness of breath, orthopnea, PND, lower extremity edema, dizziness, presyncope, syncope, bleeding, or neurologic sequela. The patient is tolerating medications without difficulties and is otherwise without complaint today.    Atrial Fibrillation Risk Factors:  she does have symptoms or diagnosis of sleep apnea. Patient declined sleep study. she does not have a history of rheumatic fever.   she has a BMI of Body mass index is 39.14 kg/m.Marland Kitchen Filed Weights   10/25/20 1414  Weight: 97.1 kg    Family History  Problem Relation Age of Onset   Atrial fibrillation Mother    Cancer  Mother      Atrial Fibrillation Management history:  Previous antiarrhythmic drugs: dofetilide Previous cardioversions: 07/09/20 Previous ablations: none CHADS2VASC score: 4 Anticoagulation history: Eliquis   Past Medical History:  Diagnosis Date   Atrial fibrillation (HCC) 10/09/2020   Cardiomyopathy (HCC)    Hypertension    Obesity    Persistent atrial fibrillation (HCC)    Past Surgical History:  Procedure Laterality Date   CARDIOVERSION N/A 07/09/2020   Procedure: CARDIOVERSION;  Surgeon: Pricilla Riffle, MD;  Location: [facility] ENDOSCOPY;  Service: Cardiovascular;  Laterality: N/A;   TONSILECTOMY, ADENOIDECTOMY, BILATERAL MYRINGOTOMY AND TUBES N/A     Current Outpatient Medications  Medication Sig Dispense Refill   ALPHA LIPOIC ACID PO Take 600 mg by mouth daily.      apixaban (ELIQUIS) 5 MG TABS tablet Take 1 tablet (5 mg total) by mouth 2 (two) times daily. 60 tablet 11   Ascorbic Acid (VITAMIN C) 1000 MG tablet Take 1,000 mg by mouth daily.     B Complex Vitamins (VITAMIN-B COMPLEX PO) Take 1 tablet by mouth daily.     Coenzyme Q10 (COQ10) 100 MG CAPS Take 100 mg by mouth daily.     D-Ribose POWD Take 5 capsules by mouth daily.      diltiazem (CARDIZEM) 30 MG tablet Take 1 tablet (30 mg total) by mouth 2 (two) times daily as needed (for heart rate persistently greater then 110). 60 tablet 6   dofetilide (TIKOSYN) 250 MCG capsule Take 1 capsule (250 mcg total) by mouth 2 (two) times daily. 60 capsule 6   furosemide (LASIX) 40  MG tablet TAKE 1 TAB (40 MG TOTAL) BY MOUTH DAILY (Patient taking differently: Take 40 mg by mouth daily. ) 90 tablet 3   Lactobacillus (PROBIOTIC ACIDOPHILUS) CAPS Take 1 capsule by mouth every 12 (twelve) hours. Sometimes takes liquid form     losartan (COZAAR) 100 MG tablet Take 1 tablet (100 mg total) by mouth daily. 30 tablet 6   Magnesium 100 MG TABS Take 100 mg by mouth at bedtime.      melatonin 1 MG TABS tablet Take 1 mg by  mouth at bedtime.     OVER THE COUNTER MEDICATION Apply 1 application topically 2 (two) times a week. Vit D3 cream/anumed/vitamin K- Apply 1 squirt (5,000 units) to the skin once a day in the Summer and 1 squirt (5,000 units) two times a day in the Winter     ZINC GLUCONATE PO Take 10 mg by mouth every evening.     No current facility-administered medications for this encounter.    No Known Allergies  Social History   Socioeconomic History   Marital status: Married    Spouse name: Not on file   Number of children: Not on file   Years of education: Not on file   Highest education level: Not on file  Occupational History   Not on file  Tobacco Use   Smoking status: Never Smoker   Smokeless tobacco: Never Used  Vaping Use   Vaping Use: Never used  Substance and Sexual Activity   Alcohol use: Not Currently    Comment: rare beer on occasion   Drug use: Never   Sexual activity: Not on file  Other Topics Concern   Not on file  Social History Narrative   Lives in [place],  Recently retired IT consultant   Social Determinants of Corporate investment banker Strain:    Difficulty of Paying Living Expenses: Not on file  Food Insecurity:    Worried About Programme researcher, broadcasting/film/video in the Last Year: Not on file   [employer] of Food in the Last Year: Not on file  Transportation Needs:    Freight forwarder (Medical): Not on file   Lack of Transportation (Non-Medical): Not on file  Physical Activity:    Days of Exercise per Week: Not on file   Minutes of Exercise per Session: Not on file  Stress:    Feeling of Stress : Not on file  Social Connections:    Frequency of Communication with Friends and Family: Not on file   Frequency of Social Gatherings with Friends and Family: Not on file   Attends Religious Services: Not on file   Active Member of Clubs or Organizations: Not on file   Attends Banker Meetings: Not on file   Marital Status: Not on  file  Intimate Partner Violence:    Fear of Current or Ex-Partner: Not on file   Emotionally Abused: Not on file   Physically Abused: Not on file   Sexually Abused: Not on file     ROS- All systems are reviewed and negative except as per the HPI above.  Physical Exam: Vitals:   10/25/20 1414  BP: 138/82  Pulse: 81  Weight: 97.1 kg  Height: 5\' 2"  (1.575 m)    GEN- The patient is well appearing obese female, alert and oriented x 3 today.   HEENT-head normocephalic, atraumatic, sclera clear, conjunctiva pink, hearing intact, trachea midline. Lungs- Clear to ausculation bilaterally, normal work of breathing Heart- Regular rate and  rhythm, no murmurs, rubs or gallops  GI- soft, NT, ND, + BS Extremities- no clubbing, cyanosis, or edema MS- no significant deformity or atrophy Skin- no rash or lesion Psych- euthymic mood, full affect Neuro- strength and sensation are intact   Wt Readings from Last 3 Encounters:  10/25/20 97.1 kg  10/19/20 96.9 kg  10/12/20 95.5 kg    EKG today demonstrates SR HR 81, PR 190, QRS 84, QTc 504  Echo 06/10/20 demonstrated  1. LVEF is severely depressed The proximal portion of LV contracts an  distal 2/3 is severely hypokinetic/akinetic . Left ventricular ejection  fraction, by estimation, is 25%%. The left ventricle has severely  decreased function. The left ventricle  demonstrates regional wall motion abnormalities (see scoring  diagram/findings for description). There is mild left ventricular  hypertrophy. Left ventricular diastolic parameters are indeterminate.  2. Proximal portion of RV contracts; mid/distal is akinetic/dyskinetic..  Right ventricular systolic function is severely reduced. The right  ventricular size is normal. There is mildly elevated pulmonary artery  systolic pressure.  3. Left atrial size was moderately dilated.  4. Right atrial size was moderately dilated.  5. The mitral valve is abnormal. Mild mitral valve  regurgitation.  6. The aortic valve is abnormal. Aortic valve regurgitation is trivial.  Mild aortic valve sclerosis is present, with no evidence of aortic valve  stenosis.  7. The inferior vena cava is dilated in size with <50% respiratory  variability, suggesting right atrial pressure of 15 mmHg.   Epic records are reviewed at length today  CHA2DS2-VASc Score = 4  The patient's score is based upon: CHF History: 1 HTN History: 1 Diabetes History: 0 Stroke History: 0 Vascular Disease History: 0      ASSESSMENT AND PLAN: 1. Persistent Atrial Fibrillation/atrial flutter The patient's CHA2DS2-VASc score is 4, indicating a 4.8% annual risk of stroke.   S/p dofetilide loading 10/12-10/15/21 We had a long discussion about therapeutic options today. Also d/w Dr Johney Frame. Appears dofetilide is not controlling her afib and her QT has lengthened. We discussed changing to amiodarone. Patient adamant about not starting amiodarone 2/2 concern about side effects. We discussed that uncontrolled afib is likely contributing to her heart failure and good control is essential.  Patient would like to continue dofetilide for now. Will decrease dose to 125 mcg BID given borderline QT. Avoiding scheduled rate control agents with post termination pauses/bradycardia.  May use diltiazem 60 mg PRN BID for heart rate >100 Continue Eliquis 5 mg BID.  2. Secondary Hypercoagulable State (ICD10:  D68.69) The patient is at significant risk for stroke/thromboembolism based upon her CHA2DS2-VASc Score of 4.  Continue Apixaban (Eliquis).   3. Obesity Body mass index is 39.14 kg/m. Lifestyle modification was discussed and encouraged including regular physical activity and weight reduction.  4. Chronic systolic CHF EF 25% by echo Possibly related to afib. Patient refused LHC. No signs or symptoms of fluid overload. Repeat echo scheduled.   5. HTN Stable, no changes today.   Follow up next week for ECG.     Jorja Loa PA-C Afib Clinic [facility] [address] [place], [place] [address] [phone] 10/25/2020 2:29 PM

## 2020-10-25 NOTE — Patient Instructions (Signed)
DECREASE Tikosyn to twice a day

## 2020-10-30 ENCOUNTER — Ambulatory Visit (HOSPITAL_COMMUNITY)
Admission: RE | Admit: 2020-10-30 | Discharge: 2020-10-30 | Disposition: A | Discharge status: Home / Self Care | Payer: Medicare Other | Source: Ambulatory Visit | Attending: Physician Assistant | Admitting: Physician Assistant

## 2020-10-30 ENCOUNTER — Other Ambulatory Visit: Payer: Self-pay

## 2020-10-30 VITALS — BP 142/80 | HR 78

## 2020-10-30 DIAGNOSIS — R9431 Abnormal electrocardiogram [ECG] [EKG]: Secondary | ICD-10-CM | POA: Insufficient documentation

## 2020-10-30 DIAGNOSIS — I4819 Other persistent atrial fibrillation: Secondary | ICD-10-CM

## 2020-10-30 DIAGNOSIS — I4891 Unspecified atrial fibrillation: Secondary | ICD-10-CM | POA: Diagnosis not present

## 2020-10-30 DIAGNOSIS — I517 Cardiomegaly: Secondary | ICD-10-CM | POA: Insufficient documentation

## 2020-10-30 NOTE — Progress Notes (Signed)
Patient returns for ECG after decreasing dofetilide. ECG shows SR HR 78, LAD, PR 198, QRS 88, QTc 469. QT shortened on lower dose. She also brings in her Kardia mobile strips which continue to show frequent episodes of rapid afib. We again discussed role of amiodarone. She would like to think about it and discuss with Francis Dowse at follow up in two weeks. Follow up as scheduled.

## 2020-11-11 NOTE — Progress Notes (Signed)
Cardiology Office Note Date:  11/11/2020  Patient ID:  Anita Stanton, Anita Stanton 1956/10/01, MRN [id number] PCP:  System, Provider Not In  Cardiologist:  Dr. Cristal Deer Electrophysiologist: Dr. Johney Frame    Chief Complaint: post Erskine Squibb follow up  History of Present Illness: Anita Stanton is a 64 y.o. female with history of w/PMHx of HTN, obesity, recently found AFib and new CM and CHF (suspcet 2/2 AFib, though pt declined cath for evaluation of coronaries).  She had ERAF after DCCV.  She was admitted for Tikosyn initiation, She had a long and symptomatic post conversion pause (9 seconds) after dose #1 >> SB 48-50's and her diltiazem and metoprolol were held  Sinus rates improved, mostly 50's-60's, some 70's with exertion.  During the stay had PAFib, day of discharge in Afib 120's mostly discharged on Tikosyn. Her home BB stopped 2/2 bradycardia/post termination pause, her ARB titrated with some discussion perhaps of Anita Stanton out patient (defer to gen cardiology team) Continued PRN diltiazem, not daily 2/2 post termination pause and CM Discharged 10/12/20  At her 1 week follow up in the Afib clinic she was in SR with stable QTc She saw AFib clinic again 10/28 with PAF noted via her Kardia device.  At this visit her QT slightly prolonged, discussed changing to amiodarone, the patient wanted to continue Tikosyn, her dose reduced to 10/30/20 f/u ekg with improved QT, continued to have PAFib, pt was going to consider amiodarone perhaps discuss further at her follow up.   TODAY She comes with her husband. She reports still a little fatigued but better, less SOB.  She thinks being off the betablocker also helped quite a bit. It has been their observation that as time passes she is in SR more and more. That most of her episodes are fairly short and only once has had to take her PRN diltiazem. And reports she has had days without any AF as well. She can tell when she goes back into  rhythm with a fleeting lightheadedness, never any near syncope or syncope. No CP No bleeding or signs of bleeding   She arrives today in AFlutter w/RVR, says she knew she was out of rhythm, started as they were leaving the house, her cats started to fight and then the dog joined and they had to break that up. She has observed the stooping/bending over will trigger episodes and has started to avoid this movement as well.   Afib Hx Diagnosed June 2021 ERAF after DCCV Referred to Dr. Johney Frame, 08/06/20, AAd options tikosyn and amiodarone, planned for medical management with Tikosyn prior to consideration for ablation  AAD Tikosyn started 10/09/20   Past Medical History:  Diagnosis Date  . Atrial fibrillation (HCC) 10/09/2020  . Cardiomyopathy (HCC)   . Hypertension   . Obesity   . Persistent atrial fibrillation [facility])     Past Surgical History:  Procedure Laterality Date  . CARDIOVERSION N/A 07/09/2020   Procedure: CARDIOVERSION;  Surgeon: Pricilla Riffle, MD;  Location[facility] END[facility]COPY;  Service: Cardiovascular;  Laterality: N/A;  . TONSILECTOMY, ADENOIDECTOMY, BILATERAL MYRINGOTOMY AND TUBES N/A     Current Outpatient Medications  Medication Sig Dispense Refill  . ALPHA LIPOIC ACID PO Take 600 mg by mouth daily.     Marland Kitchen apixaban (ELIQUIS) 5 MG TABS tablet Take 1 tablet (5 mg total) by mouth 2 (two) times daily. 60 tablet 11  . Ascorbic Acid (VITAMIN C) 1000 MG tablet Take 1,000 mg by mouth daily.    Marland Kitchen  B Complex Vitamins (VITAMIN-B COMPLEX PO) Take 1 tablet by mouth daily.    . Coenzyme Q10 (COQ10) 100 MG CAPS Take 100 mg by mouth daily.    Marland Kitchen D-Ribose POWD Take 5 capsules by mouth daily.     Marland Kitchen diltiazem (CARDIZEM) 30 MG tablet Take 1 tablet (30 mg total) by mouth 2 (two) times daily as needed (for heart rate persistently greater then 110). 60 tablet 6  . dofetilide (TIKOSYN) 125 MCG capsule Take 1 capsule (125 mcg total) by mouth 2 (two) times daily. 60 capsule 1  . furosemide (LASIX) 40  MG tablet TAKE 1 TAB (40 MG TOTAL) BY MOUTH DAILY (Patient taking differently: Take 40 mg by mouth daily. ) 90 tablet 3  . Lactobacillus (PROBIOTIC ACIDOPHILUS) CAPS Take 1 capsule by mouth every 12 (twelve) hours. Sometimes takes liquid form    . losartan (COZAAR) 100 MG tablet Take 1 tablet (100 mg total) by mouth daily. 30 tablet 6  . Magnesium 100 MG TABS Take 100 mg by mouth at bedtime.     . melatonin 1 MG TABS tablet Take 1 mg by mouth at bedtime.    Marland Kitchen OVER THE COUNTER MEDICATION Apply 1 application topically 2 (two) times a week. Vit D3 cream/anumed/vitamin K- Apply 1 squirt (5,000 units) to the skin once a day in the Summer and 1 squirt (5,000 units) two times a day in the Winter    . ZINC GLUCONATE PO Take 10 mg by mouth every evening.     No current facility-administered medications for this visit.    Allergies:   Patient has no known allergies.   Social History:  The patient  reports that she has never smoked. She has never used smokeless tobacco. She reports previous alcohol use. She reports that she does not use drugs.   Family History:  The patient's family history includes Atrial fibrillation in her mother; Cancer in her mother.  ROS:  Please see the history of present illness.    All other systems are reviewed and otherwise negative.   PHYSICAL EXAM:  VS:  There were no vitals taken for this visit. BMI: There is no height or weight on file to calculate BMI. Well nourished, well developed, in no acute distress HEENT: normocephalic, atraumatic Neck: no JVD, carotid bruits or masses Cardiac:  RRR tachycardic >> rate controlled; no significant murmurs, no rubs, or gallops Lungs:   CTA b/l, no wheezing, rhonchi or rales Abd: soft, nontender MS: no deformity or atrophy Ext:  no edema Skin: warm and dry, no rash Neuro:  No gross deficits appreciated Psych: euthymic mood, full affect    EKG:  Done today and reviewed by myself shows  #1 is Aflutter (atypical), 140bpm #2  is SR 74bpm, measured QT , Qtc   06/10/2020: TTE IMPRESSIONS  1. LVEF is severely depressed The proximal portion of LV contracts an  distal 2/3 is severely hypokinetic/akinetic . Left ventricular ejection  fraction, by estimation, is 25%%. The left ventricle has severely  decreased function. The left ventricle  demonstrates regional wall motion abnormalities (see scoring  diagram/findings for description). There is mild left ventricular  hypertrophy. Left ventricular diastolic parameters are indeterminate.  2. Proximal portion of RV contracts; mid/distal is akinetic/dyskinetic..  Right ventricular systolic function is severely reduced. The right  ventricular size is normal. There is mildly elevated pulmonary artery  systolic pressure.  3. Left atrial size was moderately dilated.  4. Right atrial size was moderately dilated.  5.  The mitral valve is abnormal. Mild mitral valve regurgitation.  6. The aortic valve is abnormal. Aortic valve regurgitation is trivial.  Mild aortic valve sclerosis is present, with no evidence of aortic valve  stenosis.  7. The inferior vena cava is dilated in size with <50% respiratory  variability, suggesting right atrial pressure of 15 mmHg.    Recent Labs: 06/09/2020: B Natriuretic Peptide 319.8 07/06/2020: Hemoglobin 13.0; Platelets 271 10/19/2020: BUN 17; Creatinine, Ser 0.81; Magnesium 2.2; Potassium 4.8; Sodium 140  06/10/2020: Cholesterol 180; HDL 53; LDL Cholesterol 105; Total CHOL/HDL Ratio 3.4; Triglycerides 109; VLDL 22   CrCl cannot be calculated (Patient's most recent lab result is older than the maximum 21 days allowed.).   Wt Readings from Last 3 Encounters:  10/25/20 214 lb (97.1 kg)  10/19/20 213 lb 9.6 oz (96.9 kg)  10/12/20 210 lb 9.6 oz (95.5 kg)     Other studies reviewed: Additional studies/records reviewed today include: summarized above  ASSESSMENT AND PLAN:  1. Persistent Afib     CHA2DS2Vasc is 4, on  Eliquis, appropriately dosed     Tikosyn     QTc stable in dose      In/ouf of rhythm, though both her and her husband think trending towards more and more time in SR (by symptoms and her Kardia device) She on the EKG machine with conversion today noting visually what appeared <2 second pause  She really prefers to hold the course for now   2. 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 Chronic CHF     Declined cath     F/u echo and visit with Dr. Cristal Deer in place     off BB 2/2 long post termination pause     No symptoms or exam findings of volume OL     4. HTN     No changes  5. Obesity     Counseled     She denies sleep apnea and ETOH   Disposition: F/u with her echo and Dr. Cristal Deer next month as scheduled, will have her see Dr. Johney Frame in 59mo, sooner if needed.  BMET and mag today  Current medicines are reviewed at length with the patient today.  The patient did not have any concerns regarding medicines.  Norma Fredrickson, PA-C 11/11/2020 8:07 AM     [facility] [address] [place] [address] [phone] (office)  [phone] (fax)

## 2020-11-12 ENCOUNTER — Other Ambulatory Visit: Payer: Self-pay

## 2020-11-12 ENCOUNTER — Ambulatory Visit (INDEPENDENT_AMBULATORY_CARE_PROVIDER_SITE_OTHER): Payer: Medicare Other | Admitting: Physician Assistant

## 2020-11-12 ENCOUNTER — Encounter: Payer: Self-pay | Admitting: Physician Assistant

## 2020-11-12 VITALS — BP 136/98 | HR 140 | Ht 62.0 in | Wt 212.4 lb

## 2020-11-12 DIAGNOSIS — I10 Essential (primary) hypertension: Secondary | ICD-10-CM | POA: Diagnosis not present

## 2020-11-12 DIAGNOSIS — I4819 Other persistent atrial fibrillation: Secondary | ICD-10-CM | POA: Diagnosis not present

## 2020-11-12 DIAGNOSIS — Z5181 Encounter for therapeutic drug level monitoring: Secondary | ICD-10-CM

## 2020-11-12 DIAGNOSIS — I428 Other cardiomyopathies: Secondary | ICD-10-CM

## 2020-11-12 DIAGNOSIS — Z79899 Other long term (current) drug therapy: Secondary | ICD-10-CM

## 2020-11-12 NOTE — Addendum Note (Signed)
Addended by: Oleta Mouse on: 11/12/2020 01:59 PM   Modules accepted: Orders

## 2020-11-12 NOTE — Patient Instructions (Signed)
Medication Instructions:   Your physician recommends that you continue on your current medications as directed. Please refer to the Current Medication list given to you today.  *If you need a refill on your cardiac medications before your next appointment, please call your pharmacy*   Lab Work:  BMET AND MAG TODAY   If you have labs (blood work) drawn today and your tests are completely normal, you will receive your results only by: Marland Kitchen MyChart Message (if you have MyChart) OR . A paper copy in the mail If you have any lab test that is abnormal or we need to change your treatment, we will call you to review the results.   Testing/Procedures:NONE ORDERED  TODAY   Follow-Up: At [facility], you and your health needs are our priority.  As part of our continuing mission to provide you with exceptional heart care, we have created designated Provider Care Teams.  These Care Teams include your primary Cardiologist (physician) and Advanced Practice Providers (APPs -  Physician Assistants and Nurse Practitioners) who all work together to provide you with the care you need, when you need it.  We recommend signing up for the patient portal called "MyChart".  Sign up information is provided on this After Visit Summary.  MyChart is used to connect with patients for Virtual Visits (Telemedicine).  Patients are able to view lab/test results, encounter notes, upcoming appointments, etc.  Non-urgent messages can be sent to your provider as well.   To learn more about what you can do with MyChart, go to [email or web address].    Your next appointment:   2 month(s)  The format for your next appointment:   In Person  Provider:   You may see Dr. Johney Frame  or one of the following Advanced Practice Providers on your designated Care Team:    Gypsy Balsam, NP  Francis Dowse, PA-C  Casimiro Needle "Otilio Saber, [place]    Other Instructions

## 2020-11-13 ENCOUNTER — Telehealth: Payer: Self-pay | Admitting: Internal Medicine

## 2020-11-13 LAB — BASIC METABOLIC PANEL
BUN/Creatinine Ratio: 25 (ref 12–28)
BUN: 22 mg/dL (ref 8–27)
CO2: 24 mmol/L (ref 20–29)
Calcium: 9.8 mg/dL (ref 8.7–10.3)
Chloride: 102 mmol/L (ref 96–106)
Creatinine, Ser: 0.88 mg/dL (ref 0.57–1.00)
GFR calc Af Amer: 79 mL/min/{1.73_m2} (ref >59)
GFR calc non Af Amer: 69 mL/min/{1.73_m2} (ref >59)
Glucose: 99 mg/dL (ref 65–99)
Potassium: 4.8 mmol/L (ref 3.5–5.2)
Sodium: 140 mmol/L (ref 134–144)

## 2020-11-13 LAB — MAGNESIUM: Magnesium: 2.2 mg/dL (ref 1.6–2.3)

## 2020-11-13 MED ORDER — LOSARTAN POTASSIUM 100 MG PO TABS
100.0000 mg | ORAL_TABLET | Freq: Every day | ORAL | 3 refills | Status: DC
Start: 2020-11-13 — End: 2021-07-09

## 2020-11-13 NOTE — Telephone Encounter (Signed)
*  STAT* If patient is at the pharmacy, call can be transferred to refill team.   1. Which medications need to be refilled? (please list name of each medication and dose if known) losartan (COZAAR) 100 MG tablet  2. Which pharmacy/location (including street and city if local pharmacy) is medication to be sent to? [id number] - [place], [place] - [address]  3. Do they need a 30 day or 90 day supply? 90 day

## 2020-11-13 NOTE — Telephone Encounter (Signed)
Pt's medication was sent to pt's pharmacy as requested. Confirmation received.  °

## 2020-12-07 ENCOUNTER — Other Ambulatory Visit: Payer: Self-pay

## 2020-12-07 ENCOUNTER — Ambulatory Visit (HOSPITAL_COMMUNITY): Payer: Medicare Other | Attending: Cardiology

## 2020-12-07 DIAGNOSIS — I509 Heart failure, unspecified: Secondary | ICD-10-CM | POA: Diagnosis not present

## 2020-12-07 DIAGNOSIS — I428 Other cardiomyopathies: Secondary | ICD-10-CM | POA: Diagnosis not present

## 2020-12-07 DIAGNOSIS — E669 Obesity, unspecified: Secondary | ICD-10-CM | POA: Diagnosis not present

## 2020-12-07 DIAGNOSIS — I110 Hypertensive heart disease with heart failure: Secondary | ICD-10-CM | POA: Insufficient documentation

## 2020-12-07 DIAGNOSIS — I4891 Unspecified atrial fibrillation: Secondary | ICD-10-CM | POA: Diagnosis not present

## 2020-12-07 DIAGNOSIS — I340 Nonrheumatic mitral (valve) insufficiency: Secondary | ICD-10-CM | POA: Diagnosis not present

## 2020-12-07 DIAGNOSIS — I429 Cardiomyopathy, unspecified: Secondary | ICD-10-CM | POA: Diagnosis not present

## 2020-12-07 LAB — ECHOCARDIOGRAM COMPLETE
Area-P 1/2: 3.08 cm2
S' Lateral: 3.4 cm

## 2020-12-14 ENCOUNTER — Telehealth (INDEPENDENT_AMBULATORY_CARE_PROVIDER_SITE_OTHER): Payer: Medicare Other | Admitting: Cardiology

## 2020-12-14 ENCOUNTER — Encounter: Payer: Self-pay | Admitting: Cardiology

## 2020-12-14 VITALS — BP 122/71 | HR 74 | Ht 62.0 in | Wt 210.0 lb

## 2020-12-14 DIAGNOSIS — D6869 Other thrombophilia: Secondary | ICD-10-CM | POA: Diagnosis not present

## 2020-12-14 DIAGNOSIS — I480 Paroxysmal atrial fibrillation: Secondary | ICD-10-CM

## 2020-12-14 DIAGNOSIS — Z712 Person consulting for explanation of examination or test findings: Secondary | ICD-10-CM

## 2020-12-14 DIAGNOSIS — I428 Other cardiomyopathies: Secondary | ICD-10-CM

## 2020-12-14 DIAGNOSIS — I10 Essential (primary) hypertension: Secondary | ICD-10-CM | POA: Diagnosis not present

## 2020-12-14 NOTE — Patient Instructions (Signed)
Medication Instructions:  Change lasix to 40 mg as needed for swelling  *If you need a refill on your cardiac medications before your next appointment, please call your pharmacy*   Lab Work: None   Testing/Procedures: None   Follow-Up: At [facility], you and your health needs are our priority.  As part of our continuing mission to provide you with exceptional heart care, we have created designated Provider Care Teams.  These Care Teams include your primary Cardiologist (physician) and Advanced Practice Providers (APPs -  Physician Assistants and Nurse Practitioners) who all work together to provide you with the care you need, when you need it.  We recommend signing up for the patient portal called "MyChart".  Sign up information is provided on this After Visit Summary.  MyChart is used to connect with patients for Virtual Visits (Telemedicine).  Patients are able to view lab/test results, encounter notes, upcoming appointments, etc.  Non-urgent messages can be sent to your provider as well.   To learn more about what you can do with MyChart, go to [email or web address].    Your next appointment:   6 month(s)  The format for your next appointment:   In Person  Provider:   Jodelle Red, MD

## 2020-12-14 NOTE — Progress Notes (Signed)
Virtual Visit via Telephone Note   This visit type was conducted due to national recommendations for restrictions regarding the COVID-19 Pandemic (e.g. social distancing) in an effort to limit this patient's exposure and mitigate transmission in our community.  Due to her co-morbid illnesses, this patient is at least at moderate risk for complications without adequate follow up.  This format is felt to be most appropriate for this patient at this time.  The patient did not have access to video technology/had technical difficulties with video requiring transitioning to audio format only (telephone).  All issues noted in this document were discussed and addressed.  No physical exam could be performed with this format.  Please refer to the patient's chart for her  consent to telehealth for [facility].    Date:  12/14/2020   ID:  Anita Stanton, Anita Stanton April 11, 1956, MRN [id number] The patient was identified using 2 identifiers.  Patient Location: Home Provider Location: Home Office  PCP:  Buzzy Han, MD  Cardiologist:  Buford Dresser, MD  Electrophysiologist:  None   Evaluation Performed:  Follow-Up Visit  Chief Complaint:  Follow up  History of Present Illness:    Anita Stanton is a 64 y.o. female with PMH atrial fibrillation, cardiomyopathy, chronic systolic and diastolic heart failure. I met her during her hospitalization in 05/2020.  CV history: admitted 05/2020 with new cardiomyopathy, acute systolic and diastolic heart failure, afib RVR, demand ischemia found to have UTI as well. Declined cath/ischemic workup. Declined TEE-CV. Started on losartan, metoprolol succinate. Wished to have cardioversion as an outpatient, performed 07/09/20. Returned to atrial fibrillation several days later. Was seen by Dr. Rayann Heman 08/06/20, discussed ablation vs tikosyn at that time.  Today: Husband available on phone visit today. Recheck vitals improved. Has felt like she is in  sinus rhythm fairly continuously, hasn't taken PRN diltiazem in weeks.   We reviewed the results of her echo today. Very encouraging, EF normalized.   Had intermittent left leg tingling, now improved off the metoprolol. Taking lasix daily, but has noticed that since making lifestyle changes hasn't been holding on to fluid. Changing to as needed today.    Denies chest pain, shortness of breath at rest or with normal exertion. No PND, orthopnea, LE edema or unexpected weight gain. No syncope or palpitations.  Past Medical History:  Diagnosis Date  . Atrial fibrillation ([place]) 10/09/2020  . Cardiomyopathy ([place])   . Hypertension   . Obesity   . Persistent atrial fibrillation [facility])    Past Surgical History:  Procedure Laterality Date  . CARDIOVERSION N/A 07/09/2020   Procedure: CARDIOVERSION;  Surgeon: Fay Records, MD;  Location: [facility] ENDOSCOPY;  Service: Cardiovascular;  Laterality: N/A;  . TONSILECTOMY, ADENOIDECTOMY, BILATERAL MYRINGOTOMY AND TUBES N/A      Current Meds  Medication Sig  . ALPHA LIPOIC ACID PO Take 600 mg by mouth daily.   Marland Kitchen apixaban (ELIQUIS) 5 MG TABS tablet Take 1 tablet (5 mg total) by mouth 2 (two) times daily.  . Ascorbic Acid (VITAMIN C) 1000 MG tablet Take 1,000 mg by mouth daily.  . B Complex Vitamins (VITAMIN-B COMPLEX PO) Take 1 tablet by mouth daily.  . Coenzyme Q10 (COQ10) 100 MG CAPS Take 100 mg by mouth daily.  Marland Kitchen D-Ribose POWD Take 5 capsules by mouth daily.   Marland Kitchen diltiazem (CARDIZEM) 30 MG tablet Take 1 tablet (30 mg total) by mouth 2 (two) times daily as needed (for heart rate persistently greater then 110).  . dofetilide (  TIKOSYN) 125 MCG capsule Take 1 capsule (125 mcg total) by mouth 2 (two) times daily.  . furosemide (LASIX) 40 MG tablet Take 40 mg by mouth daily.  . Lactobacillus (PROBIOTIC ACIDOPHILUS) CAPS Take 1 capsule by mouth every 12 (twelve) hours. Sometimes takes liquid form  . losartan (COZAAR) 100 MG tablet Take 1 tablet (100 mg total) by  mouth daily.  . Magnesium 100 MG TABS Take 100 mg by mouth at bedtime.   . melatonin 1 MG TABS tablet Take 1 mg by mouth at bedtime.  Marland Kitchen OVER THE COUNTER MEDICATION Apply 1 application topically 2 (two) times a week. Vit D3 cream/anumed/vitamin K- Apply 1 squirt (5,000 units) to the skin once a day in the Summer and 1 squirt (5,000 units) two times a day in the Winter  . ZINC GLUCONATE PO Take 10 mg by mouth every evening.  . [DISCONTINUED] furosemide (LASIX) 40 MG tablet TAKE 1 TAB (40 MG TOTAL) BY MOUTH DAILY (Patient taking differently: Take 40 mg by mouth daily.)     Allergies:   Patient has no known allergies.   Social History   Tobacco Use  . Smoking status: Never Smoker  . Smokeless tobacco: Never Used  Vaping Use  . Vaping Use: Never used  Substance Use Topics  . Alcohol use: Not Currently    Comment: rare beer on occasion  . Drug use: Never     Family Hx: The patient's family history includes Atrial fibrillation in her mother; Cancer in her mother.  ROS:   Please see the history of present illness.    All other systems reviewed and are negative.   Prior CV studies:   The following studies were reviewed today: Echo 12/07/20 1. Left ventricular ejection fraction, by estimation, is 60 to 65%. The  left ventricle has normal function. The left ventricle has no regional  wall motion abnormalities. There is moderate asymmetric left ventricular  hypertrophy of the septal segment.  Left ventricular diastolic parameters are consistent with Grade I  diastolic dysfunction (impaired relaxation). Elevated left ventricular  end-diastolic pressure.  2. Right ventricular systolic function is normal. The right ventricular  size is normal. Tricuspid regurgitation signal is inadequate for assessing  PA pressure.  3. The mitral valve is normal in structure. Mild mitral valve  regurgitation. No evidence of mitral stenosis. Moderate mitral annular  calcification.  4. The aortic  valve is tricuspid. Aortic valve regurgitation is not  visualized. Mild aortic valve sclerosis is present, with no evidence of  aortic valve stenosis.  5. Aortic dilatation noted. There is mild dilatation of the ascending  aorta, measuring 41 mm.  6. The inferior vena cava is normal in size with greater than 50%  respiratory variability, suggesting right atrial pressure of 3 mmHg.   Echo 06/10/20 1. LVEF is severely depressed The proximal portion of LV contracts an  distal 2/3 is severely hypokinetic/akinetic . Left ventricular ejection  fraction, by estimation, is 25%%. The left ventricle has severely  decreased function. The left ventricle  demonstrates regional wall motion abnormalities (see scoring  diagram/findings for description). There is mild left ventricular  hypertrophy. Left ventricular diastolic parameters are indeterminate.  2. Proximal portion of RV contracts; mid/distal is akinetic/dyskinetic..  Right ventricular systolic function is severely reduced. The right  ventricular size is normal. There is mildly elevated pulmonary artery  systolic pressure.  3. Left atrial size was moderately dilated.  4. Right atrial size was moderately dilated.  5. The mitral valve is  abnormal. Mild mitral valve regurgitation.  6. The aortic valve is abnormal. Aortic valve regurgitation is trivial.  Mild aortic valve sclerosis is present, with no evidence of aortic valve  stenosis.  7. The inferior vena cava is dilated in size with <50% respiratory  variability, suggesting right atrial pressure of 15 mmHg.   Labs/Other Tests and Data Reviewed:    EKG:  An ECG dated 11/12/20 was personally reviewed today and demonstrated:  atrial flutter  Recent Labs: 06/09/2020: B Natriuretic Peptide 319.8 07/06/2020: Hemoglobin 13.0; Platelets 271 11/12/2020: BUN 22; Creatinine, Ser 0.88; Magnesium 2.2; Potassium 4.8; Sodium 140   Recent Lipid Panel Lab Results  Component Value Date/Time    CHOL 180 06/10/2020 08:07 AM   TRIG 109 06/10/2020 08:07 AM   HDL 53 06/10/2020 08:07 AM   CHOLHDL 3.4 06/10/2020 08:07 AM   LDLCALC 105 (H) 06/10/2020 08:07 AM    Wt Readings from Last 3 Encounters:  12/14/20 210 lb (95.3 kg)  11/12/20 212 lb 6.4 oz (96.3 kg)  10/25/20 214 lb (97.1 kg)     Objective:    Vital Signs:  BP (!) 145/81   Pulse 74   Ht '5\' 2"'  (1.575 m)   Wt 210 lb (95.3 kg)   BMI 38.41 kg/m    Speaking comfortably on the phone, no audible wheezing In no acute distress Alert and oriented Normal affect Normal speech  ASSESSMENT & PLAN:    1. Encounter to discuss test results   2. Paroxysmal atrial fibrillation (HCC)   3. Secondary hypercoagulable state ([place])   4. NICM (nonischemic cardiomyopathy) ([place])   5. Essential hypertension    Cardiomyopathy, with chronic systolic and diastolic heart failure, now with normalized EF -echo improved 11/2020 to normal range -tolerating losartan, metoprolol succinate -reports no significant swelling on lasix daily, will change to lasix PRN -see inpatient notes re: discussion of SGLT2i, though does not have formal diabetes, she does have glucose intolerance (A1c 6.0)  Atrial fibrillation/atrial flutter, paroxysmal: -chadsvasc=4 -continue apixaban -feeling much better s/p tikosyn admission. She did have a significant 9 second post termination pause, so avoiding calcium channel blocker and beta blocker unless needed PRN for tachycardia -was discharged in flutter but converted the day after discharge to sinus rhythm based on her [employer]. Feels that she has remained in sinus rhythm  Hypertension: just above goal today -continue losartan -monitor home numbers, will contact me if remains >130/80  CV risk counseling and prevention: -recommend heart healthy/Mediterranean diet, with whole grains, fruits, vegetable, fish, lean meats, nuts, and olive oil. Limit salt. -recommend moderate walking, 3-5 times/week for 30-50  minutes each session. Aim for at least 150 minutes.week. Goal should be pace of 3 miles/hours, or walking 1.5 miles in 30 minutes -recommend avoidance of tobacco products. Avoid excess alcohol. -ASCVD risk score: The 10-year ASCVD risk score Mikey Bussing DC Brooke Bonito., et al., 2013) is: 10.2%   Values used to calculate the score:     Age: 61 years     Sex: Female     Is Non-Hispanic African American: No     Diabetic: No     Tobacco smoker: No     Systolic Blood Pressure: 833 mmHg     Is BP treated: Yes     HDL Cholesterol: 53 mg/dL     Total Cholesterol: 180 mg/dL   Follow up: 6 mos  COVID-19 Education: The signs and symptoms of COVID-19 were discussed with the patient and how to seek care for testing (follow up with  PCP or arrange E-visit).  The importance of social distancing was discussed today.  Time:   Today, I have spent 14 minutes with the patient with telehealth technology discussing the above problems.     Medication Adjustments/Labs and Tests Ordered: Current medicines are reviewed at length with the patient today.  Concerns regarding medicines are outlined above.  No orders of the defined types were placed in this encounter. No orders of the defined types were placed in this encounter.   Patient Instructions  Medication Instructions:  Change lasix to 40 mg as needed for swelling  *If you need a refill on your cardiac medications before your next appointment, please call your pharmacy*   Lab Work: None   Testing/Procedures: None   Follow-Up: At [facility], you and your health needs are our priority.  As part of our continuing mission to provide you with exceptional heart care, we have created designated Provider Care Teams.  These Care Teams include your primary Cardiologist (physician) and Advanced Practice Providers (APPs -  Physician Assistants and Nurse Practitioners) who all work together to provide you with the care you need, when you need it.  We recommend  signing up for the patient portal called "MyChart".  Sign up information is provided on this After Visit Summary.  MyChart is used to connect with patients for Virtual Visits (Telemedicine).  Patients are able to view lab/test results, encounter notes, upcoming appointments, etc.  Non-urgent messages can be sent to your provider as well.   To learn more about what you can do with MyChart, go to [email or web address].    Your next appointment:   6 month(s)  The format for your next appointment:   In Person  Provider:   Buford Dresser, MD      Signed, Buford Dresser, MD  12/14/2020   [place]

## 2020-12-20 ENCOUNTER — Other Ambulatory Visit (HOSPITAL_COMMUNITY): Payer: Self-pay | Admitting: Physician Assistant

## 2021-01-16 NOTE — Progress Notes (Signed)
Virtual Visit via Telephone Note   This visit type was conducted due to national recommendations for restrictions regarding the COVID-19 Pandemic (e.g. social distancing) in an effort to limit this patient's exposure and mitigate transmission in our community.  Due to her co-morbid illnesses, this patient is at least at moderate risk for complications without adequate follow up.  This format is felt to be most appropriate for this patient at this time.  The patient did not have access to video technology/had technical difficulties with video requiring transitioning to audio format only (telephone).  All issues noted in this document were discussed and addressed.  No physical exam could be performed with this format.  Please refer to the patient's chart for her  consent to telehealth for [facility].    Date:  01/16/2021   ID:  Virgil, Slinger 1956/07/19, MRN [id number] The patient was identified using 2 identifiers.  Patient Location: Home Provider Location: Home Office  PCP:  Margot Ables, MD  Cardiologist:  Jodelle Red, MD  Electrophysiologist:  Dr. Johney Frame  Evaluation Performed:  Follow-Up Visit  Chief Complaint:   planned follow up  History of Present Illness:    Anita Stanton is a 65 y.o. female with w/PMHx of HTN, obesity, recently found AFib and new CM and CHF (suspcet 2/2 AFib, though pt declined cath for evaluation of coronaries). She had ERAF after DCCV > Tikosyn  She was admitted for Tikosyn initiation, she was observed to have a symptomatic post conversion pause (9 seconds)after dose #1>> SB 48-50's and her diltiazem and metoprolol were held Sinus ratesimproved, mostly 50's-60's, some 70's with exertion.  During the stay had PAFib, day of discharge in Afib 120's mostly discharged on Tikosyn. Her home BB stopped 2/2 bradycardia/post termination pause, her ARB titrated with some discussion perhaps of Sherryll Burger out patient (defer to gen  cardiology team) Continued PRN diltiazem, not daily 2/2 post termination pause and CM Discharged 10/12/20  At her 1 week follow up in the Afib clinic she was in SR with stable QTc She saw AFib clinic again 10/28 with PAF noted via her Kardia device.  At this visit her QT slightly prolonged, discussed changing to amiodarone, the patient wanted to continue Tikosyn, her dose reduced to 10/30/20 f/u ekg with improved QT, continued to have PAFib, pt was going to consider amiodarone perhaps discuss further at her follow up.   I saw her 11/12/20 She comes with her husband. She reports still a little fatigued but better, less SOB.  She thinks being off the betablocker also helped quite a bit. It has been their observation that as time passes she is in SR more and more. That most of her episodes are fairly short and only once has had to take her PRN diltiazem. And reports she has had days without any AF as well. She can tell when she goes back into rhythm with a fleeting lightheadedness, never any near syncope or syncope. No CP No bleeding or signs of bleeding  She arrives today in AFlutter w/RVR, says she knew she was out of rhythm, started as they were leaving the house, her cats started to fight and then the dog joined and they had to break that up. She has observed that stooping/bending over will trigger episodes and has started to avoid this movement as well. She converted to SR while in the office. The patient felt and her husband agreed though that the burden of her AF was steadily improvong and  wanted to stay the course. Planned fro follow up in a couple months.  She had a tele health visit with Dr. Cristal Deer 12/14/20, felt like she was mostly in SR  TODAY She identifies herself by birth date. She denies any CP, SOB.  Her AFib burden is minimal.  She says few and far between lasting at most only about 20 minutes, and since being on Tikosyn has not had to use the PRN  diltiazem. NO dizzy spells, near syncope or syncope. No bleeding or signs of bleeding  She is very happy with the Tikosyn and AFib burden  Afib Hx Diagnosed June 2021 ERAF after DCCV Referred to Dr. Johney Frame, 08/06/20, AAd options tikosyn and amiodarone, planned for medical management with Tikosyn prior to consideration for ablation  AAD Tikosyn started 10/09/20    Past Medical History:  Diagnosis Date  . Atrial fibrillation (HCC) 10/09/2020  . Cardiomyopathy (HCC)   . Hypertension   . Obesity   . Persistent atrial fibrillation [facility])    Past Surgical History:  Procedure Laterality Date  . CARDIOVERSION N/A 07/09/2020   Procedure: CARDIOVERSION;  Surgeon: Pricilla Riffle, MD;  [facility] ENDOSCOPY;  Service: Cardiovascular;  Laterality: N/A;  . TONSILECTOMY, ADENOIDECTOMY, BILATERAL MYRINGOTOMY AND TUBES N/A      No outpatient medications have been marked as taking for the 01/21/21 encounter (Appointment) with Sheilah Pigeon, PA-C.     Allergies:   Patient has no known allergies.   Social History   Tobacco Use  . Smoking status: Never Smoker  . Smokeless tobacco: Never Used  Vaping Use  . Vaping Use: Never used  Substance Use Topics  . Alcohol use: Not Currently    Comment: rare beer on occasion  . Drug use: Never     Family Hx: The patient's family history includes Atrial fibrillation in her mother; Cancer in her mother.  ROS:   Please see the history of present illness.    All other systems reviewed and are negative.   Prior CV studies:   The following studies were reviewed today:  12/07/2020: TTE IMPRESSIONS  1. Left ventricular ejection fraction, by estimation, is 60 to 65%. The  left ventricle has normal function. The left ventricle has no regional  wall motion abnormalities. There is moderate asymmetric left ventricular  hypertrophy of the septal segment.  Left ventricular diastolic parameters are consistent with Grade I  diastolic dysfunction  (impaired relaxation). Elevated left ventricular  end-diastolic pressure.  2. Right ventricular systolic function is normal. The right ventricular  size is normal. Tricuspid regurgitation signal is inadequate for assessing  PA pressure.  3. The mitral valve is normal in structure. Mild mitral valve  regurgitation. No evidence of mitral stenosis. Moderate mitral annular  calcification.  4. The aortic valve is tricuspid. Aortic valve regurgitation is not  visualized. Mild aortic valve sclerosis is present, with no evidence of  aortic valve stenosis.  5. Aortic dilatation noted. There is mild dilatation of the ascending  aorta, measuring 41 mm.  6. The inferior vena cava is normal in size with greater than 50%  respiratory variability, suggesting right atrial pressure of 3 mmHg.    06/10/2020: TTE IMPRESSIONS  1. LVEF is severely depressed The proximal portion of LV contracts an  distal 2/3 is severely hypokinetic/akinetic . Left ventricular ejection  fraction, by estimation, is 25%%. The left ventricle has severely  decreased function. The left ventricle  demonstrates regional wall motion abnormalities (see scoring  diagram/findings for description). There  is mild left ventricular  hypertrophy. Left ventricular diastolic parameters are indeterminate.  2. Proximal portion of RV contracts; mid/distal is akinetic/dyskinetic..  Right ventricular systolic function is severely reduced. The right  ventricular size is normal. There is mildly elevated pulmonary artery  systolic pressure.  3. Left atrial size was moderately dilated.  4. Right atrial size was moderately dilated.  5. The mitral valve is abnormal. Mild mitral valve regurgitation.  6. The aortic valve is abnormal. Aortic valve regurgitation is trivial.  Mild aortic valve sclerosis is present, with no evidence of aortic valve  stenosis.  7. The inferior vena cava is dilated in size with <50% respiratory  variability,  suggesting right atrial pressure of 15 mmHg.   Labs/Other Tests and Data Reviewed:    EKG:  none  Recent Labs: 06/09/2020: B Natriuretic Peptide 319.8 07/06/2020: Hemoglobin 13.0; Platelets 271 11/12/2020: BUN 22; Creatinine, Ser 0.88; Magnesium 2.2; Potassium 4.8; Sodium 140   Recent Lipid Panel Lab Results  Component Value Date/Time   CHOL 180 06/10/2020 08:07 AM   TRIG 109 06/10/2020 08:07 AM   HDL 53 06/10/2020 08:07 AM   CHOLHDL 3.4 06/10/2020 08:07 AM   LDLCALC 105 (H) 06/10/2020 08:07 AM    Wt Readings from Last 3 Encounters:  12/14/20 210 lb (95.3 kg)  11/12/20 212 lb 6.4 oz (96.3 kg)  10/25/20 214 lb (97.1 kg)     Risk Assessment/Calculations:     Objective:    Vital Signs:  Reported by the patient 121/77, 84bpm, 212lbs  She sounds good on the phone, very pleasant, speaks without SOB at a normal pace  ASSESSMENT & PLAN:    1. Persistent Afib     CHA2DS2Vasc is 4, on Eliquis, appropriately dosed     on Tikosyn     QTc stable in dose, by her last EKG in Nov 2021, labs then also OK     patient reports minimal AFib burden, is very happy with her current therapy  2. 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 Chronic CHF     Declined cath     F/u echo Dec 2021 LVEF 60-65%, grade I DD     off BB 2/2 long post termination pause     No symptoms or exam findings of volume OL     4. HTN     No changes  5. Obesity      She previously denied sleep apnea and ETOH      Not addressed today   COVID-19 Education: The signs and symptoms of COVID-19 were discussed with the patient and how to seek care for testing (follow up with PCP or arrange E-visit).  The importance of social distancing was discussed today.  Time:   Today, I have spent 12 minutes with the patient with telehealth technology discussing the above problems.     Medication Adjustments/Labs and Tests Ordered: Current medicines are reviewed at length with the patient today.   Concerns regarding medicines are outlined above.   Tests Ordered: No orders of the defined types were placed in this encounter.   Medication Changes: No orders of the defined types were placed in this encounter.   Follow Up:  In Person in 4 month(s)  Signed, Sheilah Pigeon, PA-C  01/16/2021 1:40 PM    [facility]

## 2021-01-21 ENCOUNTER — Encounter: Payer: Self-pay | Admitting: Physician Assistant

## 2021-01-21 ENCOUNTER — Telehealth: Payer: Self-pay | Admitting: *Deleted

## 2021-01-21 ENCOUNTER — Other Ambulatory Visit: Payer: Self-pay

## 2021-01-21 ENCOUNTER — Telehealth (INDEPENDENT_AMBULATORY_CARE_PROVIDER_SITE_OTHER): Payer: Medicare Other | Admitting: Physician Assistant

## 2021-01-21 VITALS — BP 121/77 | HR 84 | Ht 62.0 in | Wt 212.0 lb

## 2021-01-21 DIAGNOSIS — I5022 Chronic systolic (congestive) heart failure: Secondary | ICD-10-CM | POA: Diagnosis not present

## 2021-01-21 DIAGNOSIS — I10 Essential (primary) hypertension: Secondary | ICD-10-CM

## 2021-01-21 DIAGNOSIS — I4819 Other persistent atrial fibrillation: Secondary | ICD-10-CM

## 2021-01-21 DIAGNOSIS — Z5181 Encounter for therapeutic drug level monitoring: Secondary | ICD-10-CM

## 2021-01-21 DIAGNOSIS — I428 Other cardiomyopathies: Secondary | ICD-10-CM | POA: Diagnosis not present

## 2021-01-21 DIAGNOSIS — Z79899 Other long term (current) drug therapy: Secondary | ICD-10-CM

## 2021-01-21 NOTE — Telephone Encounter (Signed)
  Patient Consent for Virtual Visit         Anita Stanton has provided verbal consent on 01/21/2021 for a virtual visit (video or telephone).   CONSENT FOR VIRTUAL VISIT FOR:  Anita Stanton  By participating in this virtual visit I agree to the following:  I hereby voluntarily request, consent and authorize [facility] and its employed or contracted physicians, physician assistants, nurse practitioners or other licensed health care professionals (the Practitioner), to provide me with telemedicine health care services (the "Services") as deemed necessary by the treating Practitioner. I acknowledge and consent to receive the Services by the Practitioner via telemedicine. I understand that the telemedicine visit will involve communicating with the Practitioner through live audiovisual communication technology and the disclosure of certain medical information by electronic transmission. I acknowledge that I have been given the opportunity to request an in-person assessment or other available alternative prior to the telemedicine visit and am voluntarily participating in the telemedicine visit.  I understand that I have the right to withhold or withdraw my consent to the use of telemedicine in the course of my care at any time, without affecting my right to future care or treatment, and that the Practitioner or I may terminate the telemedicine visit at any time. I understand that I have the right to inspect all information obtained and/or recorded in the course of the telemedicine visit and may receive copies of available information for a reasonable fee.  I understand that some of the potential risks of receiving the Services via telemedicine include:  Marland Kitchen Delay or interruption in medical evaluation due to technological equipment failure or disruption; . Information transmitted may not be sufficient (e.g. poor resolution of images) to allow for appropriate medical decision making by the  Practitioner; and/or  . In rare instances, security protocols could fail, causing a breach of personal health information.  Furthermore, I acknowledge that it is my responsibility to provide information about my medical history, conditions and care that is complete and accurate to the best of my ability. I acknowledge that Practitioner's advice, recommendations, and/or decision may be based on factors not within their control, such as incomplete or inaccurate data provided by me or distortions of diagnostic images or specimens that may result from electronic transmissions. I understand that the practice of medicine is not an exact science and that Practitioner makes no warranties or guarantees regarding treatment outcomes. I acknowledge that a copy of this consent can be made available to me via my patient portal [facility] MyChart), or I can request a printed copy by calling the office of [facility].    I understand that my insurance will be billed for this visit.   I have read or had this consent read to me. . I understand the contents of this consent, which adequately explains the benefits and risks of the Services being provided via telemedicine.  . I have been provided ample opportunity to ask questions regarding this consent and the Services and have had my questions answered to my satisfaction. . I give my informed consent for the services to be provided through the use of telemedicine in my medical care

## 2021-02-28 ENCOUNTER — Other Ambulatory Visit: Payer: Self-pay | Admitting: Physician Assistant

## 2021-06-05 IMAGING — DX DG CHEST 1V PORT
1 series · 1 of 1 positions shown · non-contrast
Comparison: None.

CLINICAL DATA: Shortness of breath

EXAM:
PORTABLE CHEST 1 VIEW

[chest]
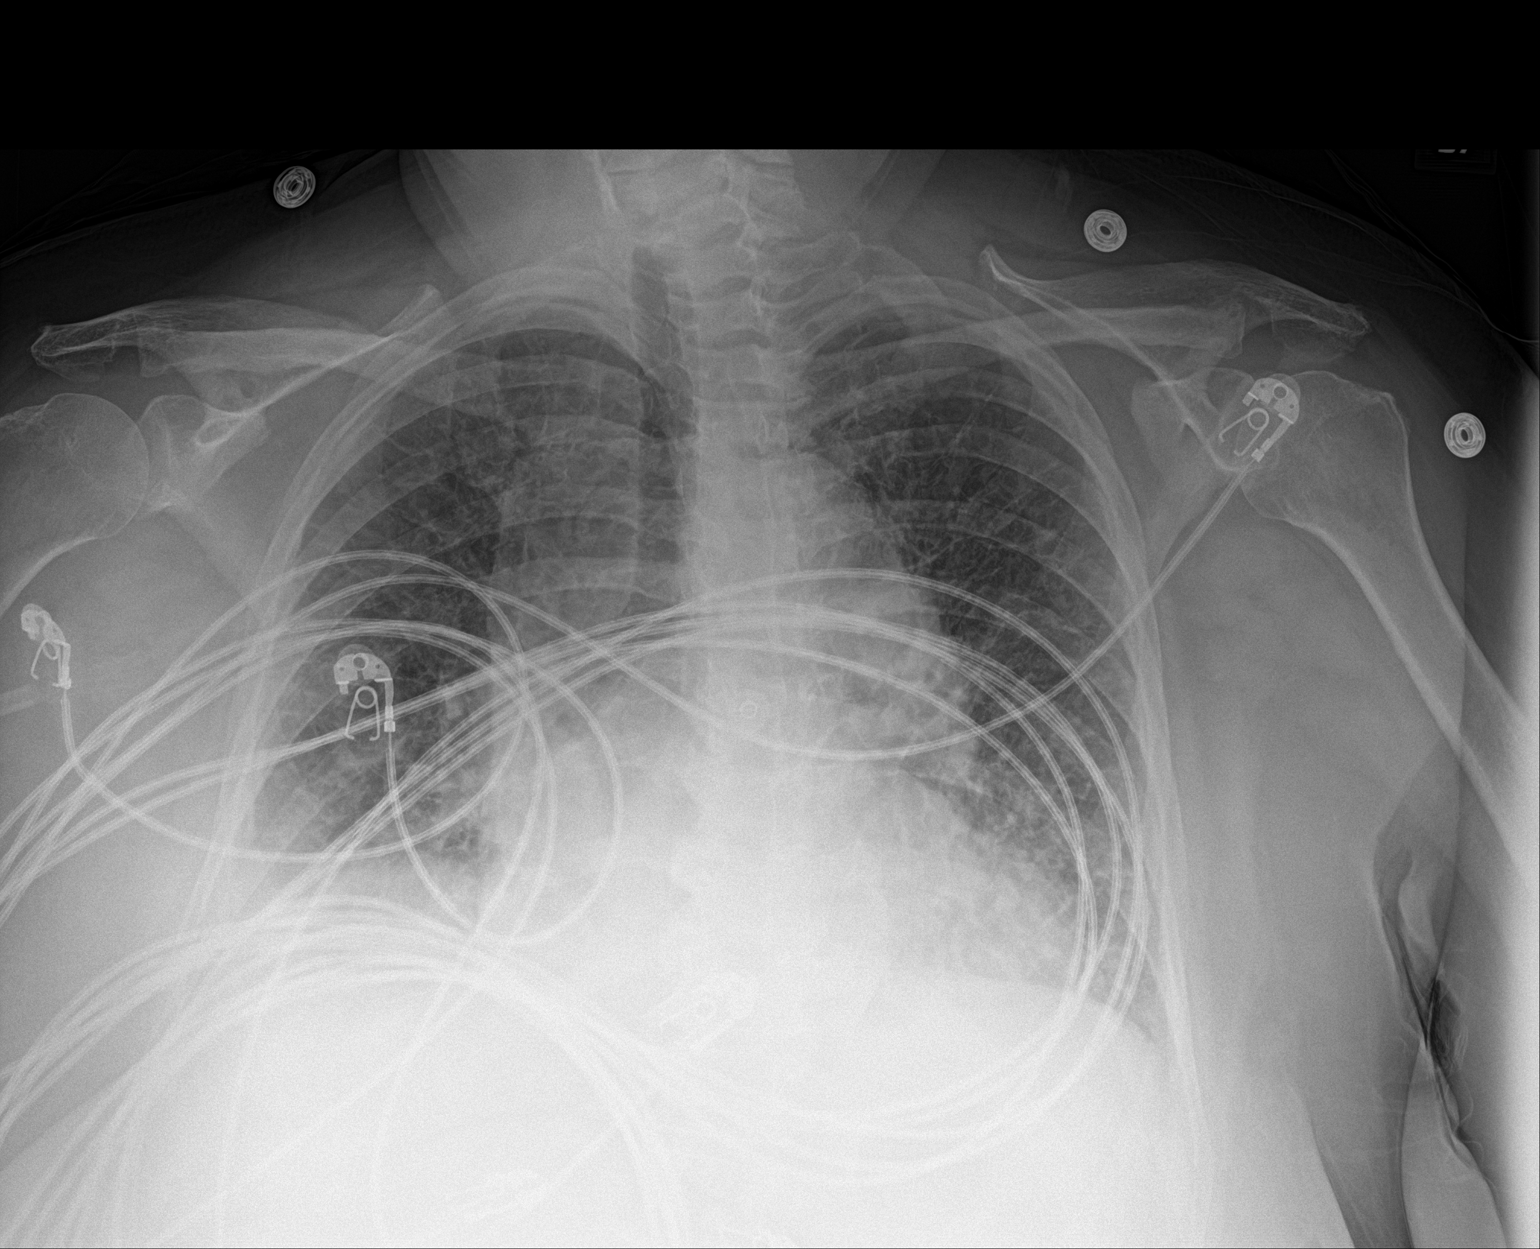

[1 of 1 positions shown; findings below may reference images not displayed]

FINDINGS: Lungs are clear. Heart is mildly enlarged with pulmonary vascularity
normal. No adenopathy. Aorta is mildly tortuous. No bone lesions.
IMPRESSION: Mild cardiac enlargement. Tortuous aorta; question chronic
hypertension. No edema or airspace opacity.

## 2021-06-17 ENCOUNTER — Encounter: Payer: Self-pay | Admitting: Cardiology

## 2021-06-17 ENCOUNTER — Telehealth (INDEPENDENT_AMBULATORY_CARE_PROVIDER_SITE_OTHER): Payer: Medicare Other | Admitting: Cardiology

## 2021-06-17 VITALS — BP 143/89 | HR 73 | Ht 62.0 in | Wt 215.0 lb

## 2021-06-17 DIAGNOSIS — D6869 Other thrombophilia: Secondary | ICD-10-CM

## 2021-06-17 DIAGNOSIS — I429 Cardiomyopathy, unspecified: Secondary | ICD-10-CM

## 2021-06-17 DIAGNOSIS — I10 Essential (primary) hypertension: Secondary | ICD-10-CM

## 2021-06-17 DIAGNOSIS — I480 Paroxysmal atrial fibrillation: Secondary | ICD-10-CM

## 2021-06-17 NOTE — Patient Instructions (Addendum)
Medication Instructions:  Your Physician recommend you continue on your current medication as directed.    *If you need a refill on your cardiac medications before your next appointment, please call your pharmacy*   Lab Work: None ordered today   Testing/Procedures: None ordered today   Follow-Up: At [facility], you and your health needs are our priority.  As part of our continuing mission to provide you with exceptional heart care, we have created designated Provider Care Teams.  These Care Teams include your primary Cardiologist (physician) and Advanced Practice Providers (APPs -  Physician Assistants and Nurse Practitioners) who all work together to provide you with the care you need, when you need it.  We recommend signing up for the patient portal called "MyChart".  Sign up information is provided on this After Visit Summary.  MyChart is used to connect with patients for Virtual Visits (Telemedicine).  Patients are able to view lab/test results, encounter notes, upcoming appointments, etc.  Non-urgent messages can be sent to your provider as well.   To learn more about what you can do with MyChart, go to [email or web address].    Your next appointment:   6 month(s) @ [address] [place], [place] [address]   The format for your next appointment:   In Person  Provider:   Bridgette Christopher, MD     

## 2021-06-17 NOTE — Progress Notes (Signed)
Virtual Visit via Telephone Note   This visit type was conducted due to national recommendations for restrictions regarding the COVID-19 Pandemic (e.g. social distancing) in an effort to limit this patient's exposure and mitigate transmission in our community.  Due to her co-morbid illnesses, this patient is at least at moderate risk for complications without adequate follow up.  This format is felt to be most appropriate for this patient at this time.  The patient did not have access to video technology/had technical difficulties with video requiring transitioning to audio format only (telephone).  All issues noted in this document were discussed and addressed.  No physical exam could be performed with this format.  Please refer to the patient's chart for her  consent to telehealth for [facility].    Date:  06/17/2021   ID:  Anita Stanton, Anita Stanton [birth year], MRN [id number] The patient was identified using 2 identifiers.  Patient Location: Home Provider Location: Home Office  PCP:  Buzzy Han, MD  Cardiologist:  Buford Dresser, MD  Electrophysiologist:  None   Evaluation Performed:  Follow-Up Visit  Chief Complaint:  Follow up  History of Present Illness:    Anita Stanton is a 65 y.o. female with PMH atrial fibrillation, cardiomyopathy, chronic systolic and diastolic heart failure. I met her during her hospitalization in 05/2020.  CV history: admitted 05/2020 with new cardiomyopathy, acute systolic and diastolic heart failure, afib RVR, demand ischemia found to have UTI as well. Declined cath/ischemic workup. Declined TEE-CV. Started on losartan, metoprolol succinate. Wished to have cardioversion as an outpatient, performed 07/09/20. Returned to atrial fibrillation several days later. Was seen by Dr. Rayann Heman 08/06/20, discussed ablation vs tikosyn at that time.  Today: Husband on phone visit today. Two questions: has varicose veins that need to be addressed,  planned for evaluation with [facility]. Discussed that depending on plans for intervention, may need to discuss apixaban hold temporarily. No plans yet.  Also planning to look into bioidenticals for hormone replacement therapy. Discussed that this should be monitored, risk of thrombosis with some estrogens. She wants to lose weight and is hoping this will help her. Hasn't had an appt yet to discuss.   Taking lasix only as needed.   Denies chest pain, shortness of breath at rest or with normal exertion. No PND, orthopnea, LE edema or unexpected weight gain. No syncope.   Feels like she is in sinus at least 90% of the time, but does have brief episodes of afib especially when she bends down and stands up quickly.   We discussed sleep apnea as a risk factor for atrial fibrillation. We discussed sleep study and clinical diagnosis of sleep apnea. She feels interrupted sleep is more of an issue, though she can get 5-6 hours of uninterrupted sleep before she wakes up.  Tikosyn has gone up in price, working on prior [place] for this.  Past Medical History:  Diagnosis Date   Atrial fibrillation ([place]) 10/09/2020   Cardiomyopathy ([place])    Hypertension    Obesity    Persistent atrial fibrillation ([place])    Past Surgical History:  Procedure Laterality Date   CARDIOVERSION N/A 07/09/2020   Procedure: CARDIOVERSION;  Surgeon: Fay Records, MD;  Location: [facility] ENDOSCOPY;  Service: Cardiovascular;  Laterality: N/A;   TONSILECTOMY, ADENOIDECTOMY, BILATERAL MYRINGOTOMY AND TUBES N/A      Current Meds  Medication Sig   ALPHA LIPOIC ACID PO Take 600 mg by mouth daily.    apixaban (ELIQUIS) 5 MG  TABS tablet Take 1 tablet (5 mg total) by mouth 2 (two) times daily.   Ascorbic Acid (VITAMIN C) 1000 MG tablet Take 1,000 mg by mouth daily.   B Complex Vitamins (VITAMIN-B COMPLEX PO) Take 1 tablet by mouth daily.   Coenzyme Q10 (COQ10) 100 MG CAPS Take 100 mg by mouth daily.   D-Ribose POWD Take 5 capsules by  mouth daily.    diltiazem (CARDIZEM) 30 MG tablet Take 1 tablet (30 mg total) by mouth 2 (two) times daily as needed (for heart rate persistently greater then 110).   dofetilide (TIKOSYN) 125 MCG capsule TAKE ONE CAPSULE BY MOUTH TWICE A DAY   furosemide (LASIX) 40 MG tablet Take 40 mg by mouth daily as needed for fluid.   Lactobacillus (PROBIOTIC ACIDOPHILUS) CAPS Take 1 capsule by mouth every 12 (twelve) hours. Sometimes takes liquid form   losartan (COZAAR) 100 MG tablet Take 1 tablet (100 mg total) by mouth daily.   Magnesium 100 MG TABS Take 100 mg by mouth at bedtime.    melatonin 1 MG TABS tablet Take 1 mg by mouth at bedtime.   OVER THE COUNTER MEDICATION Apply 1 application topically 2 (two) times a week. Vit D3 cream/anumed/vitamin K- Apply 1 squirt (5,000 units) to the skin once a day in the Summer and 1 squirt (5,000 units) two times a day in the [place] Take 10 mg by mouth every evening.     Allergies:   Metoprolol   Social History   Tobacco Use   Smoking status: Never   Smokeless tobacco: Never  Vaping Use   Vaping Use: Never used  Substance Use Topics   Alcohol use: Not Currently    Comment: rare beer on occasion   Drug use: Never     Family Hx: The patient's family history includes Atrial fibrillation in her mother; Cancer in her mother.  ROS:   Please see the history of present illness.    All other systems reviewed and are negative.   Prior CV studies:   The following studies were reviewed today: Echo 12/07/20 1. Left ventricular ejection fraction, by estimation, is 60 to 65%. The  left ventricle has normal function. The left ventricle has no regional  wall motion abnormalities. There is moderate asymmetric left ventricular  hypertrophy of the septal segment.  Left ventricular diastolic parameters are consistent with Grade I  diastolic dysfunction (impaired relaxation). Elevated left ventricular  end-diastolic pressure.   2. Right  ventricular systolic function is normal. The right ventricular  size is normal. Tricuspid regurgitation signal is inadequate for assessing  PA pressure.   3. The mitral valve is normal in structure. Mild mitral valve  regurgitation. No evidence of mitral stenosis. Moderate mitral annular  calcification.   4. The aortic valve is tricuspid. Aortic valve regurgitation is not  visualized. Mild aortic valve sclerosis is present, with no evidence of  aortic valve stenosis.   5. Aortic dilatation noted. There is mild dilatation of the ascending  aorta, measuring 41 mm.   6. The inferior vena cava is normal in size with greater than 50%  respiratory variability, suggesting right atrial pressure of 3 mmHg.   Echo 06/10/20 1. LVEF is severely depressed The proximal portion of LV contracts an  distal 2/3 is severely hypokinetic/akinetic . Left ventricular ejection  fraction, by estimation, is 25%%. The left ventricle has severely  decreased function. The left ventricle  demonstrates regional wall motion abnormalities (see scoring  diagram/findings for description). There is mild left ventricular  hypertrophy. Left ventricular diastolic parameters are indeterminate.   2. Proximal portion of RV contracts; mid/distal is akinetic/dyskinetic..  Right ventricular systolic function is severely reduced. The right  ventricular size is normal. There is mildly elevated pulmonary artery  systolic pressure.   3. Left atrial size was moderately dilated.   4. Right atrial size was moderately dilated.   5. The mitral valve is abnormal. Mild mitral valve regurgitation.   6. The aortic valve is abnormal. Aortic valve regurgitation is trivial.  Mild aortic valve sclerosis is present, with no evidence of aortic valve  stenosis.   7. The inferior vena cava is dilated in size with <50% respiratory  variability, suggesting right atrial pressure of 15 mmHg.   Labs/Other Tests and Data Reviewed:    EKG:  An ECG  dated 11/12/20 was personally reviewed today and demonstrated:  atrial flutter  Recent Labs: 07/06/2020: Hemoglobin 13.0; Platelets 271 11/12/2020: BUN 22; Creatinine, Ser 0.88; Magnesium 2.2; Potassium 4.8; Sodium 140   Recent Lipid Panel Lab Results  Component Value Date/Time   CHOL 180 06/10/2020 08:07 AM   TRIG 109 06/10/2020 08:07 AM   HDL 53 06/10/2020 08:07 AM   CHOLHDL 3.4 06/10/2020 08:07 AM   LDLCALC 105 (H) 06/10/2020 08:07 AM    Wt Readings from Last 3 Encounters:  06/17/21 215 lb (97.5 kg)  01/21/21 212 lb (96.2 kg)  12/14/20 210 lb (95.3 kg)     Objective:    Vital Signs:  BP (!) 143/89   Pulse 73   Ht _0  (1.575 m)   Wt 215 lb (97.5 kg)   BMI 39.32 kg/m    Speaking comfortably on the phone, no audible wheezing In no acute distress Alert and oriented Normal affect Normal speech   ASSESSMENT & PLAN:    1. Paroxysmal atrial fibrillation (HCC)   2. Primary hypertension   3. Secondary hypercoagulable state ([place])   4. Essential hypertension   5. Cardiomyopathy, unspecified type ([place])     Cardiomyopathy, with chronic systolic and diastolic heart failure, now with normalized EF -echo improved 11/2020 to normal range -tolerating losartan, metoprolol succinate -using lasix only as needed -see inpatient notes re: discussion of SGLT2i, though does not have formal diabetes, she does have glucose intolerance (A1c 6.0)  Atrial fibrillation/atrial flutter, paroxysmal: -chadsvasc=4 -continue apixaban -feeling much better s/p tikosyn admission. She did have a significant 9 second post termination pause, so avoiding calcium channel blocker and beta blocker unless needed PRN for tachycardia -discussed that if she discusses procedures or hormone replacement, she should make sure her providers are aware of her anticoagulation  Hypertension: above goal today -continue losartan -working on weight loss  CV risk counseling and prevention: -recommend heart  healthy/Mediterranean diet, with whole grains, fruits, vegetable, fish, lean meats, nuts, and olive oil. Limit salt. -recommend moderate walking, 3-5 times/week for 30-50 minutes each session. Aim for at least 150 minutes.week. Goal should be pace of 3 miles/hours, or walking 1.5 miles in 30 minutes -recommend avoidance of tobacco products. Avoid excess alcohol. -ASCVD risk score: The 10-year ASCVD risk score Mikey Bussing DC Brooke Bonito., et al., 2013) is: 11%   Values used to calculate the score:     Age: 24 years     Sex: Female     Is Non-Hispanic African American: No     Diabetic: No     Tobacco smoker: No     Systolic Blood Pressure: 419 mmHg  Is BP treated: Yes     HDL Cholesterol: 53 mg/dL     Total Cholesterol: 180 mg/dL   Follow up: 6 mos  Today, I have spent 27 minutes with the patient with telehealth technology discussing the above problems.  Additional time spent in chart review, documentation, and communication.    Medication Adjustments/Labs and Tests Ordered: Current medicines are reviewed at length with the patient today.  Concerns regarding medicines are outlined above.  No orders of the defined types were placed in this encounter. No orders of the defined types were placed in this encounter.   Patient Instructions  Medication Instructions:  Your Physician recommend you continue on your current medication as directed.    *If you need a refill on your cardiac medications before your next appointment, please call your pharmacy*   Lab Work: None ordered today   Testing/Procedures: None ordered today   Follow-Up: At [facility], you and your health needs are our priority.  As part of our continuing mission to provide you with exceptional heart care, we have created designated Provider Care Teams.  These Care Teams include your primary Cardiologist (physician) and Advanced Practice Providers (APPs -  Physician Assistants and Nurse Practitioners) who all work together to  provide you with the care you need, when you need it.  We recommend signing up for the patient portal called "MyChart".  Sign up information is provided on this After Visit Summary.  MyChart is used to connect with patients for Virtual Visits (Telemedicine).  Patients are able to view lab/test results, encounter notes, upcoming appointments, etc.  Non-urgent messages can be sent to your provider as well.   To learn more about what you can do with MyChart, go to [email or web address].    Your next appointment:   6 month(s) @ [address] [place], [place] [address]   The format for your next appointment:   In Person  Provider:   Buford Dresser, MD      Signed, Buford Dresser, MD  06/17/2021   [place]

## 2021-06-24 ENCOUNTER — Other Ambulatory Visit: Payer: Self-pay | Admitting: Physician Assistant

## 2021-06-24 NOTE — Telephone Encounter (Signed)
38f, 96.3kg, scr 0.88 11/12/20, lovw/christopher 06/17/21

## 2021-07-09 ENCOUNTER — Other Ambulatory Visit: Payer: Self-pay

## 2021-07-09 ENCOUNTER — Other Ambulatory Visit (HOSPITAL_BASED_OUTPATIENT_CLINIC_OR_DEPARTMENT_OTHER): Payer: Self-pay | Admitting: *Deleted

## 2021-07-09 MED ORDER — DOFETILIDE 125 MCG PO CAPS: 125.0000 ug | ORAL_CAPSULE | Freq: Two times a day (BID) | ORAL | 2 refills | Status: DC

## 2021-07-09 MED ORDER — LOSARTAN POTASSIUM 100 MG PO TABS: 100.0000 mg | ORAL_TABLET | Freq: Every day | ORAL | 2 refills | Status: DC

## 2021-07-09 MED ORDER — FUROSEMIDE 40 MG PO TABS: 40.0000 mg | ORAL_TABLET | Freq: Every day | ORAL | 2 refills | Status: DC | PRN

## 2021-07-09 MED ORDER — DOFETILIDE 125 MCG PO CAPS: 125.0000 ug | ORAL_CAPSULE | Freq: Two times a day (BID) | ORAL | 1 refills | Status: DC

## 2021-07-09 MED ORDER — APIXABAN 5 MG PO TABS: 5.0000 mg | ORAL_TABLET | Freq: Two times a day (BID) | ORAL | 1 refills | Status: DC

## 2021-07-09 NOTE — Telephone Encounter (Signed)
22f, 96.3kg, scr 0.88 11/12/20, lovw/christopher 06/17/21. Eliquis (90 supply w/1 refill) refilled to [facility] and tikosyn needs to be sent to [facility]. Will send back to [facility] to complete the rest. Also losartan and lasix needs to be refilled to [facility] and there needs to be a reply to the pt advice request once the rest is completed

## 2021-12-19 ENCOUNTER — Encounter (HOSPITAL_BASED_OUTPATIENT_CLINIC_OR_DEPARTMENT_OTHER): Payer: Self-pay

## 2022-01-15 ENCOUNTER — Other Ambulatory Visit: Payer: Self-pay | Admitting: Cardiology

## 2022-01-15 NOTE — Telephone Encounter (Signed)
Rx request sent to pharmacy.  

## 2022-01-31 NOTE — Progress Notes (Signed)
Cardiology Office Note:    Date:  02/03/2022   ID:  Anita Stanton, Anita Stanton [birth year], MRN [id number]  PCP:  Buzzy Han, MD  Cardiologist:  Buford Dresser, MD  Referring MD: Buzzy Han*   Chief Complaint:  Follow up  History of Present Illness:    Anita Stanton is a 66 y.o. female with PMH atrial fibrillation, cardiomyopathy, chronic systolic and diastolic heart failure. I met her during her hospitalization in 05/2020.  CV history: admitted 05/2020 with new cardiomyopathy, acute systolic and diastolic heart failure, afib RVR, demand ischemia found to have UTI as well. Declined cath/ischemic workup. Declined TEE-CV. Started on losartan, metoprolol succinate. Wished to have cardioversion as an outpatient, performed 07/09/20. Returned to atrial fibrillation several days later. Was seen by Dr. Rayann Heman 08/06/20, discussed ablation vs tikosyn at that time. Has since been on tikosyn.  Today: Overall, she is feeling pretty good.  She continues to have intermittent but short episodes of palpitations/atrial fibrillation. Depending on how active she is during the day, she may develop multiple 30 second episodes of palpitations. Previously, she has gone a few weeks at a time without feeling any palpitations or Afib episodes.  She endorses persistent edema in her bilateral ankles.  If she does have a high carb dinner, she will take Lasix. Typically she tries to avoid carbs and sugar in her diet. She reports some weight gain over the holidays.  Every now and then she suffers from stomach pain. Having a small amount of milk or cheese seems to help relieve this.  She denies any chest pain, or shortness of breath. No lightheadedness, headaches, syncope, orthopnea, PND, or exertional symptoms.   Past Medical History:  Diagnosis Date   Atrial fibrillation ([place]) 10/09/2020   Cardiomyopathy ([place])    Hypertension    Obesity    Persistent atrial fibrillation ([place])     Past Surgical History:  Procedure Laterality Date   CARDIOVERSION N/A 07/09/2020   Procedure: CARDIOVERSION;  Surgeon: Fay Records, MD;  Location: [facility] ENDOSCOPY;  Service: Cardiovascular;  Laterality: N/A;   TONSILECTOMY, ADENOIDECTOMY, BILATERAL MYRINGOTOMY AND TUBES N/A      Current Meds  Medication Sig   ALPHA LIPOIC ACID PO Take 600 mg by mouth daily.    apixaban (ELIQUIS) 5 MG TABS tablet Take 1 tablet (5 mg total) by mouth 2 (two) times daily.   Ascorbic Acid (VITAMIN C) 1000 MG tablet Take 1,000 mg by mouth daily.   B Complex Vitamins (VITAMIN-B COMPLEX PO) Take 1 tablet by mouth daily.   Coenzyme Q10 (COQ10) 100 MG CAPS Take 100 mg by mouth daily.   D-Ribose POWD Take 5 capsules by mouth daily.    dofetilide (TIKOSYN) 125 MCG capsule Take 1 capsule (125 mcg total) by mouth 2 (two) times daily. Please schedule appointment with Dr. Harrell Gave for refills.   furosemide (LASIX) 40 MG tablet Take 1 tablet (40 mg total) by mouth daily as needed for fluid.   Lactobacillus (PROBIOTIC ACIDOPHILUS) CAPS Take 1 capsule by mouth every 12 (twelve) hours. Sometimes takes liquid form   losartan (COZAAR) 100 MG tablet Take 1 tablet (100 mg total) by mouth daily.   Magnesium 100 MG TABS Take 100 mg by mouth at bedtime.    melatonin 1 MG TABS tablet Take 1 mg by mouth at bedtime.   OVER THE COUNTER MEDICATION Apply 1 application topically 2 (two) times a week. Vit D3 cream/anumed/vitamin K- Apply 1 squirt (5,000 units) to the skin once a day  in the Summer and 1 squirt (5,000 units) two times a day in the [place] Take 10 mg by mouth every evening.     Allergies:   Metoprolol   Social History   Tobacco Use   Smoking status: Never   Smokeless tobacco: Never  Vaping Use   Vaping Use: Never used  Substance Use Topics   Alcohol use: Not Currently    Comment: rare beer on occasion   Drug use: Never     Family Hx: The patient's family history includes Atrial  fibrillation in her mother; Cancer in her mother.  ROS:   Please see the history of present illness.    (+) Palpitations (+) Edema in bilateral ankles All other systems reviewed and are negative.   Prior CV studies:   The following studies were reviewed today:  Echo 12/07/20 1. Left ventricular ejection fraction, by estimation, is 60 to 65%. The  left ventricle has normal function. The left ventricle has no regional  wall motion abnormalities. There is moderate asymmetric left ventricular  hypertrophy of the septal segment.  Left ventricular diastolic parameters are consistent with Grade I  diastolic dysfunction (impaired relaxation). Elevated left ventricular  end-diastolic pressure.   2. Right ventricular systolic function is normal. The right ventricular  size is normal. Tricuspid regurgitation signal is inadequate for assessing  PA pressure.   3. The mitral valve is normal in structure. Mild mitral valve  regurgitation. No evidence of mitral stenosis. Moderate mitral annular  calcification.   4. The aortic valve is tricuspid. Aortic valve regurgitation is not  visualized. Mild aortic valve sclerosis is present, with no evidence of  aortic valve stenosis.   5. Aortic dilatation noted. There is mild dilatation of the ascending  aorta, measuring 41 mm.   6. The inferior vena cava is normal in size with greater than 50%  respiratory variability, suggesting right atrial pressure of 3 mmHg.   Echo 06/10/20 1. LVEF is severely depressed The proximal portion of LV contracts an  distal 2/3 is severely hypokinetic/akinetic . Left ventricular ejection  fraction, by estimation, is 25%%. The left ventricle has severely  decreased function. The left ventricle  demonstrates regional wall motion abnormalities (see scoring  diagram/findings for description). There is mild left ventricular  hypertrophy. Left ventricular diastolic parameters are indeterminate.   2. Proximal portion of RV  contracts; mid/distal is akinetic/dyskinetic..  Right ventricular systolic function is severely reduced. The right  ventricular size is normal. There is mildly elevated pulmonary artery  systolic pressure.   3. Left atrial size was moderately dilated.   4. Right atrial size was moderately dilated.   5. The mitral valve is abnormal. Mild mitral valve regurgitation.   6. The aortic valve is abnormal. Aortic valve regurgitation is trivial.  Mild aortic valve sclerosis is present, with no evidence of aortic valve  stenosis.   7. The inferior vena cava is dilated in size with <50% respiratory  variability, suggesting right atrial pressure of 15 mmHg.   Labs/Other Tests and Data Reviewed:    EKG:  EKG is personally reviewed. 02/03/2022: NSR at 75 bpm 11/12/20: Atrial flutter.  Recent Labs: No results found for requested labs within last 8760 hours.   Recent Lipid Panel Lab Results  Component Value Date/Time   CHOL 180 06/10/2020 08:07 AM   TRIG 109 06/10/2020 08:07 AM   HDL 53 06/10/2020 08:07 AM   CHOLHDL 3.4 06/10/2020 08:07 AM   LDLCALC 105 (H)  06/10/2020 08:07 AM    Wt Readings from Last 3 Encounters:  02/03/22 222 lb 3.2 oz (100.8 kg)  06/17/21 215 lb (97.5 kg)  01/21/21 212 lb (96.2 kg)     Objective:    Vital Signs:  BP 140/86    Pulse 75    Ht '5\' 2"'  (1.575 m)    Wt 222 lb 3.2 oz (100.8 kg)    SpO2 95%    BMI 40.64 kg/m    GEN: Well nourished, well developed in no acute distress HEENT: Normal, moist mucous membranes NECK: No JVD CARDIAC: regular rhythm, normal S1 and S2, no rubs or gallops. No murmur. VASCULAR: Radial and DP pulses 2+ bilaterally. No carotid bruits RESPIRATORY:  Clear to auscultation without rales, wheezing or rhonchi  ABDOMEN: Soft, non-tender, non-distended MUSCULOSKELETAL:  Ambulates independently SKIN: Warm and dry, Bilateral prominent ankle bursa NEUROLOGIC:  Alert and oriented x 3. No focal neuro deficits noted. PSYCHIATRIC:  Normal affect    ASSESSMENT & PLAN:    1. Encounter for long-term current use of high risk medication   2. Paroxysmal atrial fibrillation (HCC)   3. Secondary hypercoagulable state ([place])   4. Primary hypertension   5. Cardiac risk counseling   6. Counseling on health promotion and disease prevention   7. Long term current use of anticoagulant   8. History of cardiomyopathy     Cardiomyopathy, with chronic systolic and diastolic heart failure, now with normalized EF -echo improved 11/2020 to normal range -tolerating losartan. She inquired if this could be stopped, as she feels her blood pressure is at a good level for her. We discussed the benefits of ARB in cardiomyopathy, she will continue -did not tolerate metoprolol -using lasix only as needed -see inpatient notes re: discussion of SGLT2i, declines  Atrial fibrillation/atrial flutter, paroxysmal: -chadsvasc=4 -continue apixaban -continue tikosyn, monitoring labs today for chronic high risk medication use  Hypertension: above goal today -continue losartan -we discussed goal <130/80, she prefers not to intensify medications at this time  Obesity, BMI 40 -working on weight loss -we discussed [place] today. She is hesitant about it but will contact me or her PCP if she becomes interested.  CV risk counseling and prevention: -recommend heart healthy/Mediterranean diet, with whole grains, fruits, vegetable, fish, lean meats, nuts, and olive oil. Limit salt. -recommend moderate walking, 3-5 times/week for 30-50 minutes each session. Aim for at least 150 minutes.week. Goal should be pace of 3 miles/hours, or walking 1.5 miles in 30 minutes -recommend avoidance of tobacco products. Avoid excess alcohol. -ASCVD risk score: The 10-year ASCVD risk score (Arnett DK, et al., 2019) is: 11.8%   Values used to calculate the score:     Age: 52 years     Sex: Female     Is Non-Hispanic African American: No     Diabetic: No     Tobacco smoker: No      Systolic Blood Pressure: 696 mmHg     Is BP treated: Yes     HDL Cholesterol: 53 mg/dL     Total Cholesterol: 180 mg/dL    Plan for follow-up: 1 year or sooner as needed.  Medication Adjustments/Labs and Tests Ordered: Current medicines are reviewed at length with the patient today.  Concerns regarding medicines are outlined above.   No orders of the defined types were placed in this encounter.  Orders Placed This Encounter  Procedures   Basic Metabolic Panel (BMET)   Magnesium   CBC   EKG 12-Lead  Patient Instructions  Medication Instructions:  Your Physician recommend you continue on your current medication as directed.    *If you need a refill on your cardiac medications before your next appointment, please call your pharmacy*   Lab Work: Your provider has recommended lab work today (CBC,BMP, Mg). Please have this collected at [facility] at [place]. The lab is open 8:00 am - 4:30 pm. Please avoid 12:00p - 1:00p for lunch hour. You do not need an appointment. Please go to [address] [place], [place] [address]. This is in the Primary Care office on the 3rd floor, let them know you are there for blood work and they will direct you to the lab.  If you have labs (blood work) drawn today and your tests are completely normal, you will receive your results only by: [place] (if you have MyChart) OR A paper copy in the mail If you have any lab test that is abnormal or we need to change your treatment, we will call you to review the results.   Testing/Procedures: None ordered today   Follow-Up: At [facility], you and your health needs are our priority.  As part of our continuing mission to provide you with exceptional heart care, we have created designated Provider Care Teams.  These Care Teams include your primary Cardiologist (physician) and Advanced Practice Providers (APPs -  Physician Assistants and Nurse Practitioners) who all work  together to provide you with the care you need, when you need it.  We recommend signing up for the patient portal called "MyChart".  Sign up information is provided on this After Visit Summary.  MyChart is used to connect with patients for Virtual Visits (Telemedicine).  Patients are able to view lab/test results, encounter notes, upcoming appointments, etc.  Non-urgent messages can be sent to your provider as well.   To learn more about what you can do with MyChart, go to [email or web address].    Your next appointment:   1 year(s)  The format for your next appointment:   In Person  Provider:   Buford Dresser, MD      [facility],acting as a scribe for Buford Dresser, MD.,have documented all relevant documentation on the behalf of Buford Dresser, MD,as directed by  Buford Dresser, MD while in the presence of Buford Dresser, MD.  I, Buford Dresser, MD, have reviewed all documentation for this visit. The documentation on 02/03/22 for the exam, diagnosis, procedures, and orders are all accurate and complete.   Signed, Buford Dresser, MD  02/03/2022   [facility]

## 2022-02-03 ENCOUNTER — Other Ambulatory Visit: Payer: Self-pay

## 2022-02-03 ENCOUNTER — Ambulatory Visit (INDEPENDENT_AMBULATORY_CARE_PROVIDER_SITE_OTHER): Payer: Medicare Other | Admitting: Cardiology

## 2022-02-03 ENCOUNTER — Encounter (HOSPITAL_BASED_OUTPATIENT_CLINIC_OR_DEPARTMENT_OTHER): Payer: Self-pay | Admitting: Cardiology

## 2022-02-03 VITALS — BP 140/86 | HR 75 | Ht 62.0 in | Wt 222.2 lb

## 2022-02-03 DIAGNOSIS — Z7901 Long term (current) use of anticoagulants: Secondary | ICD-10-CM

## 2022-02-03 DIAGNOSIS — Z7189 Other specified counseling: Secondary | ICD-10-CM

## 2022-02-03 DIAGNOSIS — Z79899 Other long term (current) drug therapy: Secondary | ICD-10-CM | POA: Diagnosis not present

## 2022-02-03 DIAGNOSIS — I480 Paroxysmal atrial fibrillation: Secondary | ICD-10-CM

## 2022-02-03 DIAGNOSIS — I10 Essential (primary) hypertension: Secondary | ICD-10-CM

## 2022-02-03 DIAGNOSIS — D6869 Other thrombophilia: Secondary | ICD-10-CM | POA: Diagnosis not present

## 2022-02-03 DIAGNOSIS — Z8679 Personal history of other diseases of the circulatory system: Secondary | ICD-10-CM

## 2022-02-03 NOTE — Patient Instructions (Signed)
Medication Instructions:  Your Physician recommend you continue on your current medication as directed.    *If you need a refill on your cardiac medications before your next appointment, please call your pharmacy*   Lab Work: Your provider has recommended lab work today (CBC,BMP, Mg). Please have this collected at [facility] at [place]. The lab is open 8:00 am - 4:30 pm. Please avoid 12:00p - 1:00p for lunch hour. You do not need an appointment. Please go to [address] [place], [place] [address]. This is in the Primary Care office on the 3rd floor, let them know you are there for blood work and they will direct you to the lab.  If you have labs (blood work) drawn today and your tests are completely normal, you will receive your results only by: MyChart Message (if you have MyChart) OR A paper copy in the mail If you have any lab test that is abnormal or we need to change your treatment, we will call you to review the results.   Testing/Procedures: None ordered today   Follow-Up: At [facility], you and your health needs are our priority.  As part of our continuing mission to provide you with exceptional heart care, we have created designated Provider Care Teams.  These Care Teams include your primary Cardiologist (physician) and Advanced Practice Providers (APPs -  Physician Assistants and Nurse Practitioners) who all work together to provide you with the care you need, when you need it.  We recommend signing up for the patient portal called "MyChart".  Sign up information is provided on this After Visit Summary.  MyChart is used to connect with patients for Virtual Visits (Telemedicine).  Patients are able to view lab/test results, encounter notes, upcoming appointments, etc.  Non-urgent messages can be sent to your provider as well.   To learn more about what you can do with MyChart, go to [email or web address].    Your next appointment:   1  year(s)  The format for your next appointment:   In Person  Provider:   Jodelle Red, MD

## 2022-02-04 LAB — BASIC METABOLIC PANEL
BUN/Creatinine Ratio: 28 (ref 12–28)
BUN: 21 mg/dL (ref 8–27)
CO2: 22 mmol/L (ref 20–29)
Calcium: 9.4 mg/dL (ref 8.7–10.3)
Chloride: 105 mmol/L (ref 96–106)
Creatinine, Ser: 0.75 mg/dL (ref 0.57–1.00)
Glucose: 98 mg/dL (ref 70–99)
Potassium: 5.3 mmol/L — ABNORMAL HIGH (ref 3.5–5.2)
Sodium: 141 mmol/L (ref 134–144)
eGFR: 87 mL/min/{1.73_m2} (ref >59)

## 2022-02-04 LAB — CBC
Hematocrit: 37.1 % (ref 34.0–46.6)
Hemoglobin: 12.2 g/dL (ref 11.1–15.9)
MCH: 28.2 pg (ref 26.6–33.0)
MCHC: 32.9 g/dL (ref 31.5–35.7)
MCV: 86 fL (ref 79–97)
Platelets: 255 10*3/uL (ref 150–450)
RBC: 4.32 x10E6/uL (ref 3.77–5.28)
RDW: 14.4 % (ref 11.7–15.4)
WBC: 7.2 10*3/uL (ref 3.4–10.8)

## 2022-02-04 LAB — MAGNESIUM: Magnesium: 2.4 mg/dL — ABNORMAL HIGH (ref 1.6–2.3)

## 2022-03-06 ENCOUNTER — Other Ambulatory Visit: Payer: Self-pay | Admitting: Cardiology

## 2022-03-07 ENCOUNTER — Encounter (HOSPITAL_BASED_OUTPATIENT_CLINIC_OR_DEPARTMENT_OTHER): Payer: Self-pay

## 2022-03-07 ENCOUNTER — Other Ambulatory Visit (HOSPITAL_BASED_OUTPATIENT_CLINIC_OR_DEPARTMENT_OTHER): Payer: Self-pay

## 2022-03-07 MED ORDER — DOFETILIDE 125 MCG PO CAPS: 125.0000 ug | ORAL_CAPSULE | Freq: Two times a day (BID) | ORAL | 3 refills | Status: DC

## 2022-03-07 MED ORDER — DOFETILIDE 125 MCG PO CAPS
125.0000 ug | ORAL_CAPSULE | Freq: Two times a day (BID) | ORAL | 3 refills | Status: DC
Start: 2022-03-07 — End: 2023-03-06

## 2022-03-07 NOTE — Telephone Encounter (Signed)
Prescription re-sent to pharmacy.

## 2022-04-04 ENCOUNTER — Other Ambulatory Visit: Payer: Self-pay | Admitting: Physician Assistant

## 2022-04-04 NOTE — Telephone Encounter (Signed)
Eliquis 5 mg refill request received. Patient is 66 years old, weight- 100.8 kg, Crea- 0.75 on 02/04/22, Diagnosis- PAF, and last seen by Dr. Cristal Deer on 02/03/22. Dose is appropriate based on dosing criteria. Will send in refill to requested pharmacy.   ?

## 2022-08-07 ENCOUNTER — Other Ambulatory Visit (HOSPITAL_BASED_OUTPATIENT_CLINIC_OR_DEPARTMENT_OTHER): Payer: Self-pay | Admitting: Cardiology

## 2022-08-07 NOTE — Telephone Encounter (Signed)
Rx request sent to pharmacy.  

## 2022-10-31 ENCOUNTER — Other Ambulatory Visit: Payer: Self-pay | Admitting: Adult Health

## 2022-11-12 ENCOUNTER — Encounter (HOSPITAL_BASED_OUTPATIENT_CLINIC_OR_DEPARTMENT_OTHER): Payer: Self-pay

## 2022-11-14 MED ORDER — DILTIAZEM HCL 30 MG PO TABS: 30.0000 mg | ORAL_TABLET | Freq: Two times a day (BID) | ORAL | 1 refills | Status: DC | PRN

## 2022-11-23 ENCOUNTER — Other Ambulatory Visit (HOSPITAL_BASED_OUTPATIENT_CLINIC_OR_DEPARTMENT_OTHER): Payer: Self-pay | Admitting: Cardiology

## 2023-01-08 ENCOUNTER — Encounter (HOSPITAL_BASED_OUTPATIENT_CLINIC_OR_DEPARTMENT_OTHER): Payer: Self-pay

## 2023-01-17 ENCOUNTER — Other Ambulatory Visit: Payer: Self-pay | Admitting: Cardiology

## 2023-01-17 DIAGNOSIS — I4891 Unspecified atrial fibrillation: Secondary | ICD-10-CM

## 2023-01-17 DIAGNOSIS — I480 Paroxysmal atrial fibrillation: Secondary | ICD-10-CM

## 2023-01-19 NOTE — Telephone Encounter (Signed)
Please review for refill. Thank you! 

## 2023-01-19 NOTE — Telephone Encounter (Signed)
Eliquis 5mg  refill request received. Patient is 67 years old, weight-100.8kg, Crea-0.75 on 02/03/22, Diagnosis-Afib, and last seen by Dr. Harrell Gave on 02/03/2022 and has an appt pending on 01/29/23. Dose is appropriate based on dosing criteria. Will send in refill to requested pharmacy.

## 2023-01-26 ENCOUNTER — Other Ambulatory Visit (HOSPITAL_BASED_OUTPATIENT_CLINIC_OR_DEPARTMENT_OTHER): Payer: Self-pay | Admitting: Cardiology

## 2023-01-26 NOTE — Telephone Encounter (Signed)
Rx request sent to pharmacy.  

## 2023-01-29 ENCOUNTER — Ambulatory Visit (INDEPENDENT_AMBULATORY_CARE_PROVIDER_SITE_OTHER): Payer: Medicare Other | Admitting: Cardiology

## 2023-01-29 VITALS — BP 148/84 | HR 105 | Wt 218.0 lb

## 2023-01-29 DIAGNOSIS — Z7901 Long term (current) use of anticoagulants: Secondary | ICD-10-CM | POA: Diagnosis not present

## 2023-01-29 DIAGNOSIS — Z8679 Personal history of other diseases of the circulatory system: Secondary | ICD-10-CM

## 2023-01-29 DIAGNOSIS — I480 Paroxysmal atrial fibrillation: Secondary | ICD-10-CM | POA: Diagnosis not present

## 2023-01-29 DIAGNOSIS — I10 Essential (primary) hypertension: Secondary | ICD-10-CM

## 2023-01-29 DIAGNOSIS — D6869 Other thrombophilia: Secondary | ICD-10-CM | POA: Diagnosis not present

## 2023-01-29 MED ORDER — APIXABAN 5 MG PO TABS: 5.0000 mg | ORAL_TABLET | Freq: Two times a day (BID) | ORAL | 3 refills | Status: DC

## 2023-01-29 MED ORDER — DILTIAZEM HCL ER 60 MG PO CP12: 60.0000 mg | ORAL_CAPSULE | Freq: Two times a day (BID) | ORAL | 3 refills | Status: DC

## 2023-01-29 NOTE — Patient Instructions (Signed)
Medication Instructions:  Your physician recommends that you continue on your current medications as directed. Please refer to the Current Medication list given to you today.  *If you need a refill on your cardiac medications before your next appointment, please call your pharmacy*  Lab Work: NONE  Testing/Procedures: NONE  Follow-Up: At [facility], you and your health needs are our priority.  As part of our continuing mission to provide you with exceptional heart care, we have created designated Provider Care Teams.  These Care Teams include your primary Cardiologist (physician) and Advanced Practice Providers (APPs -  Physician Assistants and Nurse Practitioners) who all work together to provide you with the care you need, when you need it.  We recommend signing up for the patient portal called "MyChart".  Sign up information is provided on this After Visit Summary.  MyChart is used to connect with patients for Virtual Visits (Telemedicine).  Patients are able to view lab/test results, encounter notes, upcoming appointments, etc.  Non-urgent messages can be sent to your provider as well.   To learn more about what you can do with MyChart, go to [email or web address].    Your next appointment:   3 month(s)  The format for your next appointment:   In Person  Provider:   Bridgette Christopher, MD     

## 2023-01-29 NOTE — Progress Notes (Signed)
Cardiology Office Note:    Date:  01/29/2023   ID:  Anita, Stanton 1956/09/21, MRN [id number]  PCP:  Buzzy Han, MD (Inactive)  Cardiologist:  Buford Dresser, MD  Chief Complaint:  Follow up  History of Present Illness:    Anita Stanton is a 67 y.o. female with PMH atrial fibrillation, cardiomyopathy, chronic systolic and diastolic heart failure. I met her during her hospitalization in 05/2020.  CV history: admitted 05/2020 with new cardiomyopathy, acute systolic and diastolic heart failure, afib RVR, demand ischemia found to have UTI as well. Declined cath/ischemic workup. Declined TEE-CV. Started on losartan, metoprolol succinate. Wished to have cardioversion as an outpatient, performed 07/09/20. Returned to atrial fibrillation several days later. Was seen by Dr. Rayann Heman 08/06/20, discussed ablation vs tikosyn at that time. Has since been on tikosyn.  On 01/08/2023 she messaged the office noting that she had been in Afib consistently for 10 days, with persistent heart rates in the 120s-130s. Due to fatigue from her antihypertensive she switched to taking it in the evening. She was scheduled for a follow-up appointment today.  Today, she is accompanied by her husband. Recently when she initially went into Afib her heart rates were in the 140s-150s. She states that at one time she started taking the 30 mg diltiazem BID. At first she thought it helped, as one day her heart rate was in the 70s consistently (EKG tracings she took on 1/26 personally reviewed and show NSR. However, the next day she was back in Afib with a heart rate in the 120s.) Lately, she is feeling much better with focusing on getting more sleep and watching her diet. She follows a low carb diet, frequently with salads, steamed vegetables, and fish. Currently she reports average heart rates in 100s-110s.   While lying in bed she has experienced chest pressure associated with her fast palpitations.  In  clinic today her blood pressure was 148/84 which she states is too high for her. This morning she recorded her home blood pressure as [id number] systolic. We discussed lowering the dose of her losartan as she continues to complain of associated fatigue.  Lately she is also learning more about intermittent fasting.   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 Additionally she reports an episode of vertigo.  She denies any shortness of breath, or peripheral edema. No lightheadedness, headaches, syncope, orthopnea, or PND.   Past Medical History:  Diagnosis Date   Atrial fibrillation ([place]) 10/09/2020   Cardiomyopathy ([place])    Hypertension    Obesity    Persistent atrial fibrillation ([place])    Past Surgical History:  Procedure Laterality Date   CARDIOVERSION N/A 07/09/2020   Procedure: CARDIOVERSION;  Surgeon: Fay Records, MD;  [facility] ENDOSCOPY;  Service: Cardiovascular;  Laterality: N/A;   TONSILECTOMY, ADENOIDECTOMY, BILATERAL MYRINGOTOMY AND TUBES N/A      Current Meds  Medication Sig   ALPHA LIPOIC ACID PO Take 600 mg by mouth daily.    Ascorbic Acid (VITAMIN C) 1000 MG tablet Take 1,000 mg by mouth daily.   B Complex Vitamins (VITAMIN-B COMPLEX PO) Take 1 tablet by mouth daily.   Coenzyme Q10 (COQ10) 100 MG CAPS Take 100 mg by mouth daily.   D-Ribose POWD Take 5 capsules by mouth daily.    diltiazem (CARDIZEM) 30 MG tablet Take 1 tablet (30 mg total) by mouth 2 (two) times daily as needed (heart rate greater than 110). Please  keep your upcoming appointment for refills.   dofetilide (TIKOSYN) 125 MCG capsule Take 1 capsule (125 mcg total) by mouth 2 (two) times daily. Please schedule appointment with Dr. Harrell Gave for refills.   furosemide (LASIX) 40 MG tablet Take 1 tablet (40 mg total) by mouth daily as needed for fluid.   Lactobacillus (PROBIOTIC ACIDOPHILUS) CAPS Take 1 capsule by mouth every 12 (twelve) hours. Sometimes takes  liquid form   losartan (COZAAR) 100 MG tablet TAKE 1 TABLET BY MOUTH EVERY DAY   Magnesium 100 MG TABS Take 100 mg by mouth at bedtime.    melatonin 1 MG TABS tablet Take 1 mg by mouth at bedtime.   OVER THE COUNTER MEDICATION Apply 1 application topically 2 (two) times a week. Vit D3 cream/anumed/vitamin K- Apply 1 squirt (5,000 units) to the skin once a day in the Summer and 1 squirt (5,000 units) two times a day in the [place] Take 10 mg by mouth every evening.   [DISCONTINUED] apixaban (ELIQUIS) 5 MG TABS tablet TAKE 1 TABLET BY MOUTH TWICE A DAY   [DISCONTINUED] diltiazem (CARDIZEM SR) 60 MG 12 hr capsule TAKE 1 CAPSULE (60 MG TOTAL) BY MOUTH 2 (TWO) TIMES DAILY.     Allergies:   Metoprolol   Social History   Tobacco Use   Smoking status: Never   Smokeless tobacco: Never  Vaping Use   Vaping Use: Never used  Substance Use Topics   Alcohol use: Not Currently    Comment: rare beer on occasion   Drug use: Never     Family Hx: The patient's family history includes Atrial fibrillation in her mother; Cancer in her mother.  ROS:   Please see the history of present illness.    (+) Palpitations (+) Fatigue (+) Arthralgias (+) Chest pressure All other systems reviewed and are negative.   Prior CV studies:   The following studies were reviewed today:  Echo 12/07/20 1. Left ventricular ejection fraction, by estimation, is 60 to 65%. The  left ventricle has normal function. The left ventricle has no regional  wall motion abnormalities. There is moderate asymmetric left ventricular  hypertrophy of the septal segment.  Left ventricular diastolic parameters are consistent with Grade I  diastolic dysfunction (impaired relaxation). Elevated left ventricular  end-diastolic pressure.   2. Right ventricular systolic function is normal. The right ventricular  size is normal. Tricuspid regurgitation signal is inadequate for assessing  PA pressure.   3. The mitral  valve is normal in structure. Mild mitral valve  regurgitation. No evidence of mitral stenosis. Moderate mitral annular  calcification.   4. The aortic valve is tricuspid. Aortic valve regurgitation is not  visualized. Mild aortic valve sclerosis is present, with no evidence of  aortic valve stenosis.   5. Aortic dilatation noted. There is mild dilatation of the ascending  aorta, measuring 41 mm.   6. The inferior vena cava is normal in size with greater than 50%  respiratory variability, suggesting right atrial pressure of 3 mmHg.   Echo 06/10/20 1. LVEF is severely depressed The proximal portion of LV contracts an  distal 2/3 is severely hypokinetic/akinetic . Left ventricular ejection  fraction, by estimation, is 25%%. The left ventricle has severely  decreased function. The left ventricle  demonstrates regional wall motion abnormalities (see scoring  diagram/findings for description). There is mild left ventricular  hypertrophy. Left ventricular diastolic parameters are indeterminate.   2. Proximal portion of RV contracts; mid/distal is akinetic/dyskinetic.Marland Kitchen  Right ventricular systolic function is severely reduced. The right  ventricular size is normal. There is mildly elevated pulmonary artery  systolic pressure.   3. Left atrial size was moderately dilated.   4. Right atrial size was moderately dilated.   5. The mitral valve is abnormal. Mild mitral valve regurgitation.   6. The aortic valve is abnormal. Aortic valve regurgitation is trivial.  Mild aortic valve sclerosis is present, with no evidence of aortic valve  stenosis.   7. The inferior vena cava is dilated in size with <50% respiratory  variability, suggesting right atrial pressure of 15 mmHg.   Labs/Other Tests and Data Reviewed:    EKG:  EKG is personally reviewed. 01/29/2023:  atrial fibrillation with RVR at 105 bpm 02/03/2022: NSR at 75 bpm 11/12/20: Atrial flutter.  Recent Labs: 02/03/2022: BUN 21; Creatinine, Ser  0.75; Hemoglobin 12.2; Magnesium 2.4; Platelets 255; Potassium 5.3; Sodium 141   Recent Lipid Panel Lab Results  Component Value Date/Time   CHOL 180 06/10/2020 08:07 AM   TRIG 109 06/10/2020 08:07 AM   HDL 53 06/10/2020 08:07 AM   CHOLHDL 3.4 06/10/2020 08:07 AM   LDLCALC 105 (H) 06/10/2020 08:07 AM    Wt Readings from Last 3 Encounters:  01/29/23 218 lb (98.9 kg)  02/03/22 222 lb 3.2 oz (100.8 kg)  06/17/21 215 lb (97.5 kg)     Objective:    Vital Signs:  BP (!) 148/84 (BP Location: Left Arm, Patient Position: Sitting, Cuff Size: Large)   Pulse (!) 105   Wt 218 lb (98.9 kg)   BMI 39.87 kg/m    GEN: Well nourished, well developed in no acute distress HEENT: Normal, moist mucous membranes NECK: No JVD CARDIAC: Irregularly irregular, normal S1 and S2, no rubs or gallops. No murmur. VASCULAR: Radial and DP pulses 2+ bilaterally. No carotid bruits RESPIRATORY:  Clear to auscultation without rales, wheezing or rhonchi  ABDOMEN: Soft, non-tender, non-distended MUSCULOSKELETAL:  Ambulates independently SKIN: Warm and dry, Bilateral prominent ankle bursa NEUROLOGIC:  Alert and oriented x 3. No focal neuro deficits noted. PSYCHIATRIC:  Normal affect   ASSESSMENT & PLAN:    1. Paroxysmal atrial fibrillation (HCC)   2. Secondary hypercoagulable state ([place])   3. Long term current use of anticoagulant   4. History of cardiomyopathy   5. Essential hypertension    Atrial fibrillation/atrial flutter, paroxysmal: -chadsvasc=4 -continue apixaban -continue tikosyn -discussed cardioversion today. She feels that she is gradually improving, declines for now but will contact me if she changes her mind.  Cardiomyopathy, with chronic systolic and diastolic heart failure, now with normalized EF -echo improved 11/2020 to normal range -tolerating losartan. -did not tolerate metoprolol -using lasix only as needed -see inpatient notes re: discussion of SGLT2i, declines  Hypertension:  above goal today -continue losartan -we discussed goal <130/80, after shared decision making will not change today. Readdress at follow up  Obesity, BMI 40 -working on weight loss  CV risk counseling and prevention: -recommend heart healthy/Mediterranean diet, with whole grains, fruits, vegetable, fish, lean meats, nuts, and olive oil. Limit salt. -recommend moderate walking, 3-5 times/week for 30-50 minutes each session. Aim for at least 150 minutes.week. Goal should be pace of 3 miles/hours, or walking 1.5 miles in 30 minutes -recommend avoidance of tobacco products. Avoid excess alcohol. -ASCVD risk score: The 10-year ASCVD risk score (Arnett DK, et al., 2019) is: 14.5%   Values used to calculate the score:     Age: 60 years  Sex: Female     Is Non-Hispanic African American: No     Diabetic: No     Tobacco smoker: No     Systolic Blood Pressure: 123456 mmHg     Is BP treated: Yes     HDL Cholesterol: 53 mg/dL     Total Cholesterol: 180 mg/dL    Plan for follow-up: 3 months or sooner as needed.  Medication Adjustments/Labs and Tests Ordered: Current medicines are reviewed at length with the patient today.  Concerns regarding medicines are outlined above.   Meds ordered this encounter  Medications   apixaban (ELIQUIS) 5 MG TABS tablet    Sig: Take 1 tablet (5 mg total) by mouth 2 (two) times daily.    Dispense:  180 tablet    Refill:  3    Just filled, please wait to fill until patient calls   diltiazem (CARDIZEM SR) 60 MG 12 hr capsule    Sig: Take 1 capsule (60 mg total) by mouth 2 (two) times daily.    Dispense:  180 capsule    Refill:  3   Orders Placed This Encounter  Procedures   EKG 12-Lead   Patient Instructions  Medication Instructions:  Your physician recommends that you continue on your current medications as directed. Please refer to the Current Medication list given to you today.  *If you need a refill on your cardiac medications before your next  appointment, please call your pharmacy*  Lab Work: NONE  Testing/Procedures: NONE  Follow-Up: At [facility], you and your health needs are our priority.  As part of our continuing mission to provide you with exceptional heart care, we have created designated Provider Care Teams.  These Care Teams include your primary Cardiologist (physician) and Advanced Practice Providers (APPs -  Physician Assistants and Nurse Practitioners) who all work together to provide you with the care you need, when you need it.  We recommend signing up for the patient portal called "MyChart".  Sign up information is provided on this After Visit Summary.  MyChart is used to connect with patients for Virtual Visits (Telemedicine).  Patients are able to view lab/test results, encounter notes, upcoming appointments, etc.  Non-urgent messages can be sent to your provider as well.   To learn more about what you can do with MyChart, go to [email or web address].    Your next appointment:   3 month(s)  The format for your next appointment:   In Person  Provider:   Buford Dresser, MD      [facility] Stumpf,acting as a scribe for Buford Dresser, MD.,have documented all relevant documentation on the behalf of Buford Dresser, MD,as directed by  Buford Dresser, MD while in the presence of Buford Dresser, MD.  I, Buford Dresser, MD, have reviewed all documentation for this visit. The documentation on 01/29/23 for the exam, diagnosis, procedures, and orders are all accurate and complete.   Signed, Buford Dresser, MD  01/29/2023   [facility]

## 2023-03-04 ENCOUNTER — Encounter (HOSPITAL_BASED_OUTPATIENT_CLINIC_OR_DEPARTMENT_OTHER): Payer: Self-pay | Admitting: Cardiology

## 2023-03-06 ENCOUNTER — Encounter (HOSPITAL_BASED_OUTPATIENT_CLINIC_OR_DEPARTMENT_OTHER): Payer: Self-pay

## 2023-03-06 ENCOUNTER — Other Ambulatory Visit (HOSPITAL_BASED_OUTPATIENT_CLINIC_OR_DEPARTMENT_OTHER): Payer: Self-pay | Admitting: Cardiology

## 2023-03-06 MED ORDER — DOFETILIDE 125 MCG PO CAPS: 125.0000 ug | ORAL_CAPSULE | Freq: Two times a day (BID) | ORAL | 3 refills | Status: DC

## 2023-03-18 ENCOUNTER — Encounter (HOSPITAL_BASED_OUTPATIENT_CLINIC_OR_DEPARTMENT_OTHER): Payer: Self-pay

## 2023-03-18 DIAGNOSIS — I480 Paroxysmal atrial fibrillation: Secondary | ICD-10-CM

## 2023-03-18 NOTE — Telephone Encounter (Signed)
See separate MyChart encounter. Patient sent message on incorrect Rx.  Loel Dubonnet, NP

## 2023-03-19 MED ORDER — DILTIAZEM HCL 30 MG PO TABS: 30.0000 mg | ORAL_TABLET | Freq: Two times a day (BID) | ORAL | 9 refills | Status: DC | PRN

## 2023-03-19 MED ORDER — DILTIAZEM HCL ER 60 MG PO CP12: 60.0000 mg | ORAL_CAPSULE | Freq: Two times a day (BID) | ORAL | 3 refills | Status: DC

## 2023-04-03 ENCOUNTER — Encounter (HOSPITAL_BASED_OUTPATIENT_CLINIC_OR_DEPARTMENT_OTHER): Payer: Self-pay

## 2023-04-03 MED ORDER — DILTIAZEM HCL 30 MG PO TABS: 30.0000 mg | ORAL_TABLET | Freq: Two times a day (BID) | ORAL | 3 refills | Status: DC | PRN

## 2023-04-03 MED ORDER — DILTIAZEM HCL ER 60 MG PO CP12: 60.0000 mg | ORAL_CAPSULE | Freq: Two times a day (BID) | ORAL | 3 refills | Status: DC

## 2023-04-03 NOTE — Telephone Encounter (Signed)
Ok to switch to a phone visit?

## 2023-04-03 NOTE — Addendum Note (Signed)
Addended by: Marlene Lard on: 04/03/2023 02:29 PM   Modules accepted: Orders

## 2023-04-26 ENCOUNTER — Other Ambulatory Visit (HOSPITAL_BASED_OUTPATIENT_CLINIC_OR_DEPARTMENT_OTHER): Payer: Self-pay | Admitting: Cardiology

## 2023-04-29 ENCOUNTER — Telehealth (INDEPENDENT_AMBULATORY_CARE_PROVIDER_SITE_OTHER): Payer: Medicare Other | Admitting: Cardiology

## 2023-04-29 ENCOUNTER — Encounter (HOSPITAL_BASED_OUTPATIENT_CLINIC_OR_DEPARTMENT_OTHER): Payer: Self-pay | Admitting: Cardiology

## 2023-04-29 VITALS — BP 132/94 | HR 95 | Ht 62.0 in | Wt 217.0 lb

## 2023-04-29 DIAGNOSIS — Z8679 Personal history of other diseases of the circulatory system: Secondary | ICD-10-CM | POA: Diagnosis not present

## 2023-04-29 DIAGNOSIS — D6869 Other thrombophilia: Secondary | ICD-10-CM

## 2023-04-29 DIAGNOSIS — I480 Paroxysmal atrial fibrillation: Secondary | ICD-10-CM | POA: Diagnosis not present

## 2023-04-29 DIAGNOSIS — I10 Essential (primary) hypertension: Secondary | ICD-10-CM

## 2023-04-29 DIAGNOSIS — Z7901 Long term (current) use of anticoagulants: Secondary | ICD-10-CM

## 2023-04-29 NOTE — Progress Notes (Signed)
Virtual Visit via Telephone Note   Because of Anita Stanton co-morbid illnesses, she is at least at moderate risk for complications without adequate follow up.  This format is felt to be most appropriate for this patient at this time.  The patient did not have access to video technology/had technical difficulties with video requiring transitioning to audio format only (telephone).  All issues noted in this document were discussed and addressed.  No physical exam could be performed with this format.  Please refer to the patient's chart for her consent to telehealth for [facility].   The patient was identified using 2 identifiers.  Date:  04/29/2023   ID:  Anita Stanton, DOB June 30, 1956, MRN [id number]  Patient Location: Home Provider Location: Office/Clinic  PCP:  Margot Ables, MD (Inactive)  Cardiologist:  Jodelle Red, MD  Electrophysiologist:  None   Evaluation Performed:  Follow-Up Visit  History of Present Illness:    The patient does not have symptoms concerning for COVID-19 infection (fever, chills, cough, or new shortness of breath).   Anita Stanton is a 67 y.o. female with PMH atrial fibrillation, cardiomyopathy, chronic systolic and diastolic heart failure. I met her during her hospitalization in 05/2020.  CV history: admitted 05/2020 with new cardiomyopathy, acute systolic and diastolic heart failure, afib RVR, demand ischemia found to have UTI as well. Declined cath/ischemic workup. Declined TEE-CV. Started on losartan, metoprolol succinate. Wished to have cardioversion as an outpatient, performed 07/09/20. Returned to atrial fibrillation several days later. Was seen by Dr. Johney Frame 08/06/20, discussed ablation vs tikosyn at that time. Has since been on tikosyn.  On 01/08/2023 she messaged the office noting that she had been in Afib consistently for 10 days, with persistent heart rates in the 120s-130s. Due to fatigue from her  antihypertensive she switched to taking it in the evening. She was scheduled for follow-up and reported that when she initially went into Afib her heart rates were in the 140s-150s. At one time she started taking the 30 mg diltiazem BID. At first she thought it helped, as one day her heart rate was in the 70s consistently (EKG tracings she took on 1/26 personally reviewed and show NSR. However, the next day she was back in Afib with a heart rate in the 120s.) She was feeling much better with focusing on getting more sleep and watching her diet. While lying in bed she had experienced chest pressure associated with her fast palpitations. We discussed cardioversion which she deferred as she felt she was gradually improving. Her blood pressure was 148/84 in clinic; home blood pressure was 129 systolic. We discussed lowering the dose of her losartan as she continued to complain of associated fatigue.   Today, she remains in atrial fibrillation, but the rate is better controlled. Has fatigue during the day. Vitals stable. Offered cardioversion, she declines at this time. Wants to stop daytime diltiazem as she is concerned this is part of her fatigue. Discussed 12 hour vs. 24 hr formula. Discussed risk of elevated heart rates if she does not have a rate control agent covering her part of the day. She prefers to remain on the 12 hour dose but not taken daytime dose. She will contact me with issues.  Denies chest pain, shortness of breath at rest or with normal exertion. No PND, orthopnea, LE edema or unexpected weight gain. No syncope.   Past Medical History:  Diagnosis Date   Atrial fibrillation (HCC) 10/09/2020   Cardiomyopathy (HCC)  Hypertension    Obesity    Persistent atrial fibrillation [facility])    Past Surgical History:  Procedure Laterality Date   CARDIOVERSION N/A 07/09/2020   Procedure: CARDIOVERSION;  Surgeon: Pricilla Riffle, MD;  Location: [facility] ENDOSCOPY;  Service: Cardiovascular;  Laterality: N/A;    TONSILECTOMY, ADENOIDECTOMY, BILATERAL MYRINGOTOMY AND TUBES N/A      Current Meds  Medication Sig   ALPHA LIPOIC ACID PO Take 600 mg by mouth daily.    apixaban (ELIQUIS) 5 MG TABS tablet Take 1 tablet (5 mg total) by mouth 2 (two) times daily.   Ascorbic Acid (VITAMIN C) 1000 MG tablet Take 1,000 mg by mouth daily.   B Complex Vitamins (VITAMIN-B COMPLEX PO) Take 1 tablet by mouth daily.   Coenzyme Q10 (COQ10) 100 MG CAPS Take 100 mg by mouth daily.   D-Ribose POWD Take 5 capsules by mouth daily.    diltiazem (CARDIZEM SR) 60 MG 12 hr capsule Take 1 capsule (60 mg total) by mouth 2 (two) times daily.   diltiazem (CARDIZEM) 30 MG tablet Take 1 tablet (30 mg total) by mouth 2 (two) times daily as needed (heart rate greater than 110). Please keep your upcoming appointment for refills.   dofetilide (TIKOSYN) 125 MCG capsule Take 1 capsule (125 mcg total) by mouth 2 (two) times daily. Please schedule appointment with Dr. Cristal Deer for refills.   furosemide (LASIX) 40 MG tablet Take 1 tablet (40 mg total) by mouth daily as needed for fluid.   Lactobacillus (PROBIOTIC ACIDOPHILUS) CAPS Take 1 capsule by mouth every 12 (twelve) hours. Sometimes takes liquid form   losartan (COZAAR) 100 MG tablet TAKE 1 TABLET BY MOUTH EVERY DAY   Magnesium 100 MG TABS Take 100 mg by mouth at bedtime.    melatonin 1 MG TABS tablet Take 1 mg by mouth at bedtime.   OVER THE COUNTER MEDICATION Apply 1 application topically 2 (two) times a week. Vit D3 cream/anumed/vitamin K- Apply 1 squirt (5,000 units) to the skin once a day in the Summer and 1 squirt (5,000 units) two times a day in the Winter   ZINC GLUCONATE PO Take 10 mg by mouth every evening.     Allergies:   Metoprolol   Social History   Tobacco Use   Smoking status: Never   Smokeless tobacco: Never  Vaping Use   Vaping Use: Never used  Substance Use Topics   Alcohol use: Not Currently    Comment: rare beer on occasion   Drug use: Never      Family Hx: The patient's family history includes Atrial fibrillation in her mother; Cancer in her mother.  ROS:   Please see the history of present illness. All other systems reviewed and are negative.   Prior CV studies:   The following studies were reviewed today:  Echo 12/07/20 1. Left ventricular ejection fraction, by estimation, is 60 to 65%. The  left ventricle has normal function. The left ventricle has no regional  wall motion abnormalities. There is moderate asymmetric left ventricular  hypertrophy of the septal segment.  Left ventricular diastolic parameters are consistent with Grade I  diastolic dysfunction (impaired relaxation). Elevated left ventricular  end-diastolic pressure.   2. Right ventricular systolic function is normal. The right ventricular  size is normal. Tricuspid regurgitation signal is inadequate for assessing  PA pressure.   3. The mitral valve is normal in structure. Mild mitral valve  regurgitation. No evidence of mitral stenosis. Moderate mitral annular  calcification.  4. The aortic valve is tricuspid. Aortic valve regurgitation is not  visualized. Mild aortic valve sclerosis is present, with no evidence of  aortic valve stenosis.   5. Aortic dilatation noted. There is mild dilatation of the ascending  aorta, measuring 41 mm.   6. The inferior vena cava is normal in size with greater than 50%  respiratory variability, suggesting right atrial pressure of 3 mmHg.   Echo 06/10/20 1. LVEF is severely depressed The proximal portion of LV contracts an  distal 2/3 is severely hypokinetic/akinetic . Left ventricular ejection  fraction, by estimation, is 25%%. The left ventricle has severely  decreased function. The left ventricle  demonstrates regional wall motion abnormalities (see scoring  diagram/findings for description). There is mild left ventricular  hypertrophy. Left ventricular diastolic parameters are indeterminate.   2. Proximal portion  of RV contracts; mid/distal is akinetic/dyskinetic..  Right ventricular systolic function is severely reduced. The right  ventricular size is normal. There is mildly elevated pulmonary artery  systolic pressure.   3. Left atrial size was moderately dilated.   4. Right atrial size was moderately dilated.   5. The mitral valve is abnormal. Mild mitral valve regurgitation.   6. The aortic valve is abnormal. Aortic valve regurgitation is trivial.  Mild aortic valve sclerosis is present, with no evidence of aortic valve  stenosis.   7. The inferior vena cava is dilated in size with <50% respiratory  variability, suggesting right atrial pressure of 15 mmHg.   Labs/Other Tests and Data Reviewed:    EKG:  EKG is personally reviewed. 01/29/2023:  atrial fibrillation with RVR at 105 bpm 02/03/2022: NSR at 75 bpm 11/12/20: Atrial flutter.  Recent Labs: No results found for requested labs within last 365 days.   Recent Lipid Panel Lab Results  Component Value Date/Time   CHOL 180 06/10/2020 08:07 AM   TRIG 109 06/10/2020 08:07 AM   HDL 53 06/10/2020 08:07 AM   CHOLHDL 3.4 06/10/2020 08:07 AM   LDLCALC 105 (H) 06/10/2020 08:07 AM    Wt Readings from Last 3 Encounters:  04/29/23 217 lb (98.4 kg)  01/29/23 218 lb (98.9 kg)  02/03/22 222 lb 3.2 oz (100.8 kg)     Objective:    Vital Signs:  BP (!) 132/94   Pulse 95   Ht 5\' 2"  (1.575 m)   Wt 217 lb (98.4 kg)   BMI 39.69 kg/m   Speaking comfortably on the phone, no audible wheezing In no acute distress Alert and oriented Normal affect Normal speech   ASSESSMENT & PLAN:    1. Paroxysmal atrial fibrillation (HCC)   2. Secondary hypercoagulable state (HCC)   3. Long term current use of anticoagulant   4. History of cardiomyopathy   5. Essential hypertension     Atrial fibrillation/atrial flutter, paroxysmal: -chadsvasc=4 -continue apixaban -continue tikosyn -discussed cardioversion today. She declines at this time. Discussed  that her fatigue may be due to her afib.  -she would like to stop daytime 60 mg dose of 12 hour diltiazem. Discussed extensively the risk of tachycardia with this. Offered to change to 24 hour formulation, she declines.  Cardiomyopathy, with chronic systolic and diastolic heart failure, now with normalized EF -echo improved 11/2020 to normal range -tolerating losartan. -did not tolerate metoprolol -using lasix only as needed -see prior notes re: discussion of SGLT2i, declines  Hypertension: above goal today -continue losartan -we have discussed goal <130/80, after shared decision making will not change today. Continue to address  Obesity, BMI ~40 -working on weight loss  CV risk counseling and prevention: -recommend heart healthy/Mediterranean diet, with whole grains, fruits, vegetable, fish, lean meats, nuts, and olive oil. Limit salt. -recommend moderate walking, 3-5 times/week for 30-50 minutes each session. Aim for at least 150 minutes.week. Goal should be pace of 3 miles/hours, or walking 1.5 miles in 30 minutes -recommend avoidance of tobacco products. Avoid excess alcohol. -ASCVD risk score: The 10-year ASCVD risk score (Arnett DK, et al., 2019) is: 11.7%   Values used to calculate the score:     Age: 71 years     Sex: Female     Is Non-Hispanic African American: No     Diabetic: No     Tobacco smoker: No     Systolic Blood Pressure: 132 mmHg     Is BP treated: Yes     HDL Cholesterol: 53 mg/dL     Total Cholesterol: 180 mg/dL    Plan for follow-up: 2 months or sooner as needed.  Today, I have spent 12 minutes with the patient with telehealth technology discussing the above problems.  Additional time spent in chart review, documentation, and communication.  Medication Adjustments/Labs and Tests Ordered: Current medicines are reviewed at length with the patient today.  Concerns regarding medicines are outlined above.   No orders of the defined types were placed in this  encounter.  No orders of the defined types were placed in this encounter.  Patient Instructions  Medication Instructions:  The current medical regimen is effective;  continue present plan and medications.   *If you need a refill on your cardiac medications before your next appointment, please call your pharmacy*   Lab Work: None   Testing/Procedures: None   Follow-Up: At [facility], you and your health needs are our priority.  As part of our continuing mission to provide you with exceptional heart care, we have created designated Provider Care Teams.  These Care Teams include your primary Cardiologist (physician) and Advanced Practice Providers (APPs -  Physician Assistants and Nurse Practitioners) who all work together to provide you with the care you need, when you need it.  We recommend signing up for the patient portal called "MyChart".  Sign up information is provided on this After Visit Summary.  MyChart is used to connect with patients for Virtual Visits (Telemedicine).  Patients are able to view lab/test results, encounter notes, upcoming appointments, etc.  Non-urgent messages can be sent to your provider as well.   To learn more about what you can do with MyChart, go to [email or web address].    Your next appointment:   2 month(s) telephone visit  Provider:   Jodelle Red, MD    Other Instructions None   Signed, Jodelle Red, MD  04/29/2023   [facility]

## 2023-04-29 NOTE — Patient Instructions (Signed)
Medication Instructions:  The current medical regimen is effective;  continue present plan and medications.   *If you need a refill on your cardiac medications before your next appointment, please call your pharmacy*   Lab Work: None   Testing/Procedures: None   Follow-Up: At [facility], you and your health needs are our priority.  As part of our continuing mission to provide you with exceptional heart care, we have created designated Provider Care Teams.  These Care Teams include your primary Cardiologist (physician) and Advanced Practice Providers (APPs -  Physician Assistants and Nurse Practitioners) who all work together to provide you with the care you need, when you need it.  We recommend signing up for the patient portal called "MyChart".  Sign up information is provided on this After Visit Summary.  MyChart is used to connect with patients for Virtual Visits (Telemedicine).  Patients are able to view lab/test results, encounter notes, upcoming appointments, etc.  Non-urgent messages can be sent to your provider as well.   To learn more about what you can do with MyChart, go to [email or web address].    Your next appointment:   2 month(s) telephone visit  Provider:   Jodelle Red, MD    Other Instructions None

## 2023-06-29 ENCOUNTER — Encounter (HOSPITAL_BASED_OUTPATIENT_CLINIC_OR_DEPARTMENT_OTHER): Payer: Self-pay | Admitting: Cardiology

## 2023-06-29 ENCOUNTER — Telehealth (INDEPENDENT_AMBULATORY_CARE_PROVIDER_SITE_OTHER): Payer: Medicare Other | Admitting: Cardiology

## 2023-06-29 ENCOUNTER — Encounter (HOSPITAL_BASED_OUTPATIENT_CLINIC_OR_DEPARTMENT_OTHER): Payer: Self-pay

## 2023-06-29 VITALS — BP 119/81 | HR 80 | Ht 62.0 in | Wt 218.0 lb

## 2023-06-29 DIAGNOSIS — Z7901 Long term (current) use of anticoagulants: Secondary | ICD-10-CM

## 2023-06-29 DIAGNOSIS — D6869 Other thrombophilia: Secondary | ICD-10-CM

## 2023-06-29 DIAGNOSIS — Z8679 Personal history of other diseases of the circulatory system: Secondary | ICD-10-CM

## 2023-06-29 DIAGNOSIS — I4811 Longstanding persistent atrial fibrillation: Secondary | ICD-10-CM

## 2023-06-29 DIAGNOSIS — Z6841 Body Mass Index (BMI) 40.0 and over, adult: Secondary | ICD-10-CM

## 2023-06-29 DIAGNOSIS — I10 Essential (primary) hypertension: Secondary | ICD-10-CM | POA: Diagnosis not present

## 2023-06-29 DIAGNOSIS — I480 Paroxysmal atrial fibrillation: Secondary | ICD-10-CM

## 2023-06-29 DIAGNOSIS — E669 Obesity, unspecified: Secondary | ICD-10-CM

## 2023-06-29 MED ORDER — DILTIAZEM HCL ER 60 MG PO CP12: 60.0000 mg | ORAL_CAPSULE | Freq: Two times a day (BID) | ORAL | 3 refills | Status: DC

## 2023-06-29 MED ORDER — DILTIAZEM HCL 30 MG PO TABS
30.0000 mg | ORAL_TABLET | Freq: Two times a day (BID) | ORAL | 3 refills | Status: DC | PRN
Start: 2023-06-29 — End: 2023-11-24

## 2023-06-29 NOTE — Progress Notes (Signed)
Virtual Visit via Telephone Note   Because of [name] co-morbid illnesses, she is at least at moderate risk for complications without adequate follow up.  This format is felt to be most appropriate for this patient at this time.  The patient did not have access to video technology/had technical difficulties with video requiring transitioning to audio format only (telephone).  All issues noted in this document were discussed and addressed.  No physical exam could be performed with this format.  Please refer to the patient's chart for her consent to telehealth for [facility].   The patient was identified using 2 identifiers.  Date:  06/29/2023   ID:  Anita, Stanton 1956/10/18, MRN [id number]  Patient Location: Home Provider Location: Office/Clinic  PCP:  No primary care provider on file.  Cardiologist:  Jodelle Red, MD  Electrophysiologist:  None   Evaluation Performed:  Follow-Up Visit  History of Present Illness:    Anita Stanton is a 67 y.o. female with PMH atrial fibrillation, cardiomyopathy, chronic systolic and diastolic heart failure. I met her during her hospitalization in 05/2020.  CV history: admitted 05/2020 with new cardiomyopathy, acute systolic and diastolic heart failure, afib RVR, demand ischemia found to have UTI as well. Declined cath/ischemic workup. Declined TEE-CV. Started on losartan, metoprolol succinate. Wished to have cardioversion as an outpatient, performed 07/09/20. Returned to atrial fibrillation several days later. Was seen by Dr. Johney Frame 08/06/20, discussed ablation vs tikosyn at that time. Has since been on tikosyn.  Since 12/2022 has been in atrial fibrillation majority of the time. Have discussed cardioversion on several occasions, declined.   Today: Has the following concerns today: significant swelling in ankles/feet the last few weeks. Watching what she is eating. Has been gradually improving. Heart rate has  been ranging 80s-110s typically, though can in the 130s. Under significant stress at home, has low energy. Taking diltiazem . Takes tikosyn, apixaban in AM. Takes 60 mg diltiazem in the afternoon and with the evening pills; sometimes will only take 30 mg dose in the afternoon.  Still in afib based on Kardia.   Discussed three options: 1) accept permanent afib, stop tikosyn, continue diltiazem; 2) keep tikosyn, attempt cardioversion to be back in rhythm, may be able to decrease diltiazem; 3) refer to EP to discuss ablation   Data Reviewed:    EKG:  EKG is not reviewed today  Objective:    Vital Signs:  BP 119/81   Pulse 80   Ht 5\' 2"  (1.575 m)   Wt 218 lb (98.9 kg)   BMI 39.87 kg/m   Speaking comfortably on the phone, no audible wheezing In no acute distress Alert and oriented Normal affect Normal speech   ASSESSMENT & PLAN:   Atrial fibrillation/atrial flutter, paroxysmal: -chadsvasc=4 -continue apixaban -continue tikosyn for now. See comments above re: three options for management. She wants ablation only as absolute last resort.  -she will consider cardioversion and let us know  Cardiomyopathy, with chronic systolic and diastolic heart failure, now with normalized EF -echo improved 11/2020 to normal range -tolerating losartan. -did not tolerate metoprolol -using lasix only as needed -see prior notes re: discussion of SGLT2i, declines  Hypertension: above goal today -continue losartan -we have discussed goal <130/80, after shared decision making will not change today. Continue to address  Obesity, BMI ~40 -working on weight loss  CV risk counseling and prevention: -recommend heart healthy/Mediterranean diet, with whole grains, fruits, vegetable, fish, lean meats, nuts, and olive  oil. Limit salt. -recommend moderate walking, 3-5 times/week for 30-50 minutes each session. Aim for at least 150 minutes.week. Goal should be pace of 3 miles/hours, or walking 1.5 miles in 30  minutes -recommend avoidance of tobacco products. Avoid excess alcohol.  Plan for follow-up: 3 months or sooner as needed.  Today, I have spent 28 minutes with the patient with telehealth technology discussing the above problems.  Additional time spent in chart review, documentation, and communication.  Signed, Jodelle Red, MD  06/29/2023   [facility]

## 2023-06-29 NOTE — Patient Instructions (Signed)
Medication Instructions:  Continue current medications  *If you need a refill on your cardiac medications before your next appointment, please call your pharmacy*   Lab Work: None Ordered   Testing/Procedures: None Ordered   Follow-Up: At [facility], you and your health needs are our priority.  As part of our continuing mission to provide you with exceptional heart care, we have created designated Provider Care Teams.  These Care Teams include your primary Cardiologist (physician) and Advanced Practice Providers (APPs -  Physician Assistants and Nurse Practitioners) who all work together to provide you with the care you need, when you need it.  We recommend signing up for the patient portal called "MyChart".  Sign up information is provided on this After Visit Summary.  MyChart is used to connect with patients for Virtual Visits (Telemedicine).  Patients are able to view lab/test results, encounter notes, upcoming appointments, etc.  Non-urgent messages can be sent to your provider as well.   To learn more about what you can do with MyChart, go to [email or web address].    Your next appointment:   3 month(s)  Provider:   Bridgette Christopher, MD    Other Instructions   

## 2023-07-23 ENCOUNTER — Encounter (HOSPITAL_BASED_OUTPATIENT_CLINIC_OR_DEPARTMENT_OTHER): Payer: Self-pay

## 2023-07-24 MED ORDER — FUROSEMIDE 40 MG PO TABS: 40.0000 mg | ORAL_TABLET | Freq: Every day | ORAL | 2 refills | Status: DC | PRN

## 2023-08-03 ENCOUNTER — Encounter (HOSPITAL_BASED_OUTPATIENT_CLINIC_OR_DEPARTMENT_OTHER): Payer: Self-pay

## 2023-08-10 ENCOUNTER — Ambulatory Visit (HOSPITAL_BASED_OUTPATIENT_CLINIC_OR_DEPARTMENT_OTHER): Payer: Medicare Other | Admitting: Family

## 2023-08-10 ENCOUNTER — Encounter (HOSPITAL_BASED_OUTPATIENT_CLINIC_OR_DEPARTMENT_OTHER): Payer: Self-pay | Admitting: Family

## 2023-08-10 VITALS — BP 136/70 | HR 113 | Ht 62.0 in | Wt 217.0 lb

## 2023-08-10 DIAGNOSIS — I5042 Chronic combined systolic (congestive) and diastolic (congestive) heart failure: Secondary | ICD-10-CM

## 2023-08-10 DIAGNOSIS — I4811 Longstanding persistent atrial fibrillation: Secondary | ICD-10-CM

## 2023-08-10 DIAGNOSIS — I10 Essential (primary) hypertension: Secondary | ICD-10-CM | POA: Diagnosis not present

## 2023-08-10 DIAGNOSIS — D6859 Other primary thrombophilia: Secondary | ICD-10-CM | POA: Diagnosis not present

## 2023-08-10 NOTE — Progress Notes (Signed)
Cardiology Office Note:  .   Date:  08/10/2023  ID:  Anita Stanton, DOB 1956/06/23, MRN [id number] PCP: Aviva Kluver  [facility] Providers Cardiologist:  Jodelle Red, MD    History of Present Illness: .   Anita Stanton is a 67 y.o. female with hx of atrial fibrillation, cardiomyopathy, chronic systolic and diastolic heart failure.   Admitted 05/2020 new onset cardiomyopathy, afib RVR. Declined cath/ischemic workup. Declined TEE-CV. Preferred outpatient DCCV performed 07/09/20. Returned to atrial fib several days later. Seen by Dr. Johney Frame 08/06/20 for discussion on tikosyn vs ablation. Has been on tikosyn since then.   Phone visit with Dr. Cristal Deer 06/29/23 noting being in atrial fib majority of time since 12/2022. Continued to discuss cardioversion. She noted swelling in legs, heart rate 80-110s at home. She wished to consider DCCV and leave ablation for last result.   Presents today for follow up. Notes vomiting onset around 9am this morning with sensation of eyes not focusing concerning for vertigo but no dizziness. Improving during office visit. BP at home most often at goal <130/80. Feels the diltaizem makes her feel fatigued. Taking diltiazem in late morning/afternoon and then in the evening. Taking Furosemide intermittently with improvement in edema but not resolved. 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 Interested in pursuing cardioversion.   ROS: Please see the history of present illness.    All other systems reviewed and are negative.   Studies Reviewed: Marland Kitchen   EKG Interpretation Date/Time:  Monday August 10 2023 11:06:01 EDT Ventricular Rate:  113 PR Interval:    QRS Duration:  90 QT Interval:  344 QTC Calculation: 471 R Axis:   -24  Text Interpretation: Atrial fibrillation with rapid ventricular response Confirmed by Gillian Shields ([id number]) on 08/10/2023 11:10:42 AM    Cardiac Studies & Procedures       ECHOCARDIOGRAM  ECHOCARDIOGRAM COMPLETE  12/07/2020  Narrative ECHOCARDIOGRAM REPORT    Patient Name:   Anita Stanton Date of Exam: 12/07/2020 Medical Rec #:  [id number]           Height:       62.0 in Accession #:    [id number]          Weight:       212.4 lb Date of Birth:  1956/06/21           BSA:          1.961 m Patient Age:    64 years            BP:           136/98 mmHg Patient Gender: F                   HR:           71 bpm. Exam Location:  [address]  Procedure: 2D Echo, Cardiac Doppler and Color Doppler  Indications:    I42.9 Cardiomyopathy (unspecified)  History:        Patient has prior history of Echocardiogram examinations, most recent 06/10/2020. Cardiomyopathy and CHF, Arrythmias:Atrial Fibrillation and Obesity; Risk Factors:Hypertension.  Sonographer:    Samule Ohm RDCS Referring Phys: [id number] BRIDGETTE CHRISTOPHER  IMPRESSIONS   1. Left ventricular ejection fraction, by estimation, is 60 to 65%. The left ventricle has normal function. The left ventricle has no regional wall motion abnormalities. There is moderate asymmetric left ventricular hypertrophy of the septal segment. Left ventricular diastolic parameters are consistent with Grade I diastolic dysfunction (impaired relaxation). Elevated left  ventricular end-diastolic pressure. 2. Right ventricular systolic function is normal. The right ventricular size is normal. Tricuspid regurgitation signal is inadequate for assessing PA pressure. 3. The mitral valve is normal in structure. Mild mitral valve regurgitation. No evidence of mitral stenosis. Moderate mitral annular calcification. 4. The aortic valve is tricuspid. Aortic valve regurgitation is not visualized. Mild aortic valve sclerosis is present, with no evidence of aortic valve stenosis. 5. Aortic dilatation noted. There is mild dilatation of the ascending aorta, measuring 41 mm. 6. The inferior vena cava is normal in size with greater than 50% respiratory variability, suggesting  right atrial pressure of 3 mmHg.  FINDINGS Left Ventricle: Left ventricular ejection fraction, by estimation, is 60 to 65%. The left ventricle has normal function. The left ventricle has no regional wall motion abnormalities. The left ventricular internal cavity size was normal in size. There is moderate asymmetric left ventricular hypertrophy of the septal segment. Left ventricular diastolic parameters are consistent with Grade I diastolic dysfunction (impaired relaxation). Elevated left ventricular end-diastolic pressure.  Right Ventricle: The right ventricular size is normal. No increase in right ventricular wall thickness. Right ventricular systolic function is normal. Tricuspid regurgitation signal is inadequate for assessing PA pressure.  Left Atrium: Left atrial size was normal in size.  Right Atrium: Right atrial size was normal in size.  Pericardium: There is no evidence of pericardial effusion.  Mitral Valve: The mitral valve is normal in structure. Moderate mitral annular calcification. Mild mitral valve regurgitation. No evidence of mitral valve stenosis.  Tricuspid Valve: The tricuspid valve is normal in structure. Tricuspid valve regurgitation is not demonstrated. No evidence of tricuspid stenosis.  Aortic Valve: The aortic valve is tricuspid. Aortic valve regurgitation is not visualized. Mild aortic valve sclerosis is present, with no evidence of aortic valve stenosis.  Pulmonic Valve: The pulmonic valve was normal in structure. Pulmonic valve regurgitation is trivial. No evidence of pulmonic stenosis.  Aorta: Aortic dilatation noted. There is mild dilatation of the ascending aorta, measuring 41 mm.  Venous: The inferior vena cava is normal in size with greater than 50% respiratory variability, suggesting right atrial pressure of 3 mmHg.  IAS/Shunts: No atrial level shunt detected by color flow Doppler.   LEFT VENTRICLE PLAX 2D LVIDd:         5.10 cm  Diastology LVIDs:          3.40 cm  LV e' medial:    4.35 cm/s LV PW:         1.00 cm  LV E/e' medial:  18.0 LV IVS:        1.40 cm  LV e' lateral:   6.53 cm/s LVOT diam:     1.90 cm  LV E/e' lateral: 12.0 LV SV:         71 LV SV Index:   36 LVOT Area:     2.84 cm   RIGHT VENTRICLE             IVC RV S prime:     13.80 cm/s  IVC diam: 1.30 cm TAPSE (M-mode): 1.4 cm  LEFT ATRIUM             Index       RIGHT ATRIUM           Index LA diam:        4.20 cm 2.14 cm/m  RA Pressure: 3.00 mmHg LA Vol (A2C):   55.9 ml 28.50 ml/m RA Area:     13.10 cm LA  Vol (A4C):   60.0 ml 30.59 ml/m RA Volume:   26.30 ml  13.41 ml/m LA Biplane Vol: 59.5 ml 30.34 ml/m AORTIC VALVE LVOT Vmax:   105.00 cm/s LVOT Vmean:  70.400 cm/s LVOT VTI:    0.249 m  AORTA Ao Root diam: 3.45 cm Ao Asc diam:  4.10 cm  MV E velocity: 78.50 cm/s   TRICUSPID VALVE MV A velocity: 114.00 cm/s  Estimated RAP:  3.00 mmHg MV E/A ratio:  0.69 SHUNTS Systemic VTI:  0.25 m Systemic Diam: 1.90 cm  Armanda Magic MD Electronically signed by Armanda Magic MD Signature Date/Time: 12/07/2020/5:58:30 PM    Final             Risk Assessment/Calculations:    CHA2DS2-VASc Score = 4   This indicates a 4.8% annual risk of stroke. The patient's score is based upon: CHF History: 1 HTN History: 1 Diabetes History: 0 Stroke History: 0 Vascular Disease History: 0 Age Score: 1 Gender Score: 1            Physical Exam:   VS:  BP 136/70   Pulse (!) 113   Ht 5\' 2"  (1.575 m)   Wt 217 lb (98.4 kg)   BMI 39.69 kg/m    Wt Readings from Last 3 Encounters:  08/10/23 217 lb (98.4 kg)  06/29/23 218 lb (98.9 kg)  04/29/23 217 lb (98.4 kg)    GEN: Well nourished, well developed in no acute distress NECK: No JVD; No carotid bruits CARDIAC: IRIR, tachycardic, no murmurs, rubs, gallops RESPIRATORY:  Clear to auscultation without rales, wheezing or rhonchi  ABDOMEN: Soft, non-tender, non-distended EXTREMITIES: No deformity; Bilateral 1+  pitting lower extremity edema  ASSESSMENT AND PLAN: .    Persistent atrial fibrillation / Hypercoagulable state - EKG afib RVR, stable QTc 471. Also notes vomiting this morning, not feeling well consistent with vertigo which could contribute to elevated rate. Symptomatic with fluid retention, dyspnea.  She is interested in pursuing cardioversion. No missed doses of OAC over the last three weeks. CBC, mag, BMP today.  CHA2DS2-VASc Score = 4 [CHF History: 1, HTN History: 1, Diabetes History: 0, Stroke History: 0, Vascular Disease History: 0, Age Score: 1, Gender Score: 1].  Therefore, the patient's annual risk of stroke is 4.8 %.    Denies bleeding complications on Eliquis 5mg  BID.  Informed Consent   Shared Decision Making/Informed Consent The risks (stroke, cardiac arrhythmias rarely resulting in the need for a temporary or permanent pacemaker, skin irritation or burns and complications associated with conscious sedation including aspiration, arrhythmia, respiratory failure and death), benefits (restoration of normal sinus rhythm) and alternatives of a direct current cardioversion were explained in detail to Anita Stanton and she agrees to proceed.      Combined systolic and diastolic heart failure -  Recovered LVEF by echo 11/2020. LE edema noted on exam. Anticipate volume status exacerbated by atrial fib with RVR. Recommend she take her PRN Lasix for 2-3 days then return to PRN.   HTN - BP well controlled at home. Continue present regimen.         Dispo: follow up 2 weeks after cardioversion  Signed, Alver Sorrow, NP

## 2023-08-10 NOTE — H&P (View-Only) (Signed)
 Cardiology Office Note:  .   Date:  08/10/2023  ID:  Anita Stanton, DOB 1956/06/23, MRN [id number] PCP: Aviva Kluver  [facility] Providers Cardiologist:  Jodelle Red, MD    History of Present Illness: .   Anita Stanton is a 67 y.o. female with hx of atrial fibrillation, cardiomyopathy, chronic systolic and diastolic heart failure.   Admitted 05/2020 new onset cardiomyopathy, afib RVR. Declined cath/ischemic workup. Declined TEE-CV. Preferred outpatient DCCV performed 07/09/20. Returned to atrial fib several days later. Seen by Dr. Johney Frame 08/06/20 for discussion on tikosyn vs ablation. Has been on tikosyn since then.   Phone visit with Dr. Cristal Deer 06/29/23 noting being in atrial fib majority of time since 12/2022. Continued to discuss cardioversion. She noted swelling in legs, heart rate 80-110s at home. She wished to consider DCCV and leave ablation for last result.   Presents today for follow up. Notes vomiting onset around 9am this morning with sensation of eyes not focusing concerning for vertigo but no dizziness. Improving during office visit. BP at home most often at goal <130/80. Feels the diltaizem makes her feel fatigued. Taking diltiazem in late morning/afternoon and then in the evening. Taking Furosemide intermittently with improvement in edema but not resolved. 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 Interested in pursuing cardioversion.   ROS: Please see the history of present illness.    All other systems reviewed and are negative.   Studies Reviewed: Marland Kitchen   EKG Interpretation Date/Time:  Monday August 10 2023 11:06:01 EDT Ventricular Rate:  113 PR Interval:    QRS Duration:  90 QT Interval:  344 QTC Calculation: 471 R Axis:   -24  Text Interpretation: Atrial fibrillation with rapid ventricular response Confirmed by Anita Stanton ([id number]) on 08/10/2023 11:10:42 AM    Cardiac Studies & Procedures       ECHOCARDIOGRAM  ECHOCARDIOGRAM COMPLETE  12/07/2020  Narrative ECHOCARDIOGRAM REPORT    Patient Name:   Anita Stanton Date of Exam: 12/07/2020 Medical Rec #:  [id number]           Height:       62.0 in Accession #:    [id number]          Weight:       212.4 lb Date of Birth:  1956/06/21           BSA:          1.961 m Patient Age:    64 years            BP:           136/98 mmHg Patient Gender: F                   HR:           71 bpm. Exam Location:  [address]  Procedure: 2D Echo, Cardiac Doppler and Color Doppler  Indications:    I42.9 Cardiomyopathy (unspecified)  History:        Patient has prior history of Echocardiogram examinations, most recent 06/10/2020. Cardiomyopathy and CHF, Arrythmias:Atrial Fibrillation and Obesity; Risk Factors:Hypertension.  Sonographer:    Samule Ohm RDCS Referring Phys: [id number] BRIDGETTE CHRISTOPHER  IMPRESSIONS   1. Left ventricular ejection fraction, by estimation, is 60 to 65%. The left ventricle has normal function. The left ventricle has no regional wall motion abnormalities. There is moderate asymmetric left ventricular hypertrophy of the septal segment. Left ventricular diastolic parameters are consistent with Grade I diastolic dysfunction (impaired relaxation). Elevated left  ventricular end-diastolic pressure. 2. Right ventricular systolic function is normal. The right ventricular size is normal. Tricuspid regurgitation signal is inadequate for assessing PA pressure. 3. The mitral valve is normal in structure. Mild mitral valve regurgitation. No evidence of mitral stenosis. Moderate mitral annular calcification. 4. The aortic valve is tricuspid. Aortic valve regurgitation is not visualized. Mild aortic valve sclerosis is present, with no evidence of aortic valve stenosis. 5. Aortic dilatation noted. There is mild dilatation of the ascending aorta, measuring 41 mm. 6. The inferior vena cava is normal in size with greater than 50% respiratory variability, suggesting  right atrial pressure of 3 mmHg.  FINDINGS Left Ventricle: Left ventricular ejection fraction, by estimation, is 60 to 65%. The left ventricle has normal function. The left ventricle has no regional wall motion abnormalities. The left ventricular internal cavity size was normal in size. There is moderate asymmetric left ventricular hypertrophy of the septal segment. Left ventricular diastolic parameters are consistent with Grade I diastolic dysfunction (impaired relaxation). Elevated left ventricular end-diastolic pressure.  Right Ventricle: The right ventricular size is normal. No increase in right ventricular wall thickness. Right ventricular systolic function is normal. Tricuspid regurgitation signal is inadequate for assessing PA pressure.  Left Atrium: Left atrial size was normal in size.  Right Atrium: Right atrial size was normal in size.  Pericardium: There is no evidence of pericardial effusion.  Mitral Valve: The mitral valve is normal in structure. Moderate mitral annular calcification. Mild mitral valve regurgitation. No evidence of mitral valve stenosis.  Tricuspid Valve: The tricuspid valve is normal in structure. Tricuspid valve regurgitation is not demonstrated. No evidence of tricuspid stenosis.  Aortic Valve: The aortic valve is tricuspid. Aortic valve regurgitation is not visualized. Mild aortic valve sclerosis is present, with no evidence of aortic valve stenosis.  Pulmonic Valve: The pulmonic valve was normal in structure. Pulmonic valve regurgitation is trivial. No evidence of pulmonic stenosis.  Aorta: Aortic dilatation noted. There is mild dilatation of the ascending aorta, measuring 41 mm.  Venous: The inferior vena cava is normal in size with greater than 50% respiratory variability, suggesting right atrial pressure of 3 mmHg.  IAS/Shunts: No atrial level shunt detected by color flow Doppler.   LEFT VENTRICLE PLAX 2D LVIDd:         5.10 cm  Diastology LVIDs:          3.40 cm  LV e' medial:    4.35 cm/s LV PW:         1.00 cm  LV E/e' medial:  18.0 LV IVS:        1.40 cm  LV e' lateral:   6.53 cm/s LVOT diam:     1.90 cm  LV E/e' lateral: 12.0 LV SV:         71 LV SV Index:   36 LVOT Area:     2.84 cm   RIGHT VENTRICLE             IVC RV S prime:     13.80 cm/s  IVC diam: 1.30 cm TAPSE (M-mode): 1.4 cm  LEFT ATRIUM             Index       RIGHT ATRIUM           Index LA diam:        4.20 cm 2.14 cm/m  RA Pressure: 3.00 mmHg LA Vol (A2C):   55.9 ml 28.50 ml/m RA Area:     13.10 cm LA  Vol (A4C):   60.0 ml 30.59 ml/m RA Volume:   26.30 ml  13.41 ml/m LA Biplane Vol: 59.5 ml 30.34 ml/m AORTIC VALVE LVOT Vmax:   105.00 cm/s LVOT Vmean:  70.400 cm/s LVOT VTI:    0.249 m  AORTA Ao Root diam: 3.45 cm Ao Asc diam:  4.10 cm  MV E velocity: 78.50 cm/s   TRICUSPID VALVE MV A velocity: 114.00 cm/s  Estimated RAP:  3.00 mmHg MV E/A ratio:  0.69 SHUNTS Systemic VTI:  0.25 m Systemic Diam: 1.90 cm  Armanda Magic MD Electronically signed by Armanda Magic MD Signature Date/Time: 12/07/2020/5:58:30 PM    Final             Risk Assessment/Calculations:    CHA2DS2-VASc Score = 4   This indicates a 4.8% annual risk of stroke. The patient's score is based upon: CHF History: 1 HTN History: 1 Diabetes History: 0 Stroke History: 0 Vascular Disease History: 0 Age Score: 1 Gender Score: 1            Physical Exam:   VS:  BP 136/70   Pulse (!) 113   Ht 5\' 2"  (1.575 m)   Wt 217 lb (98.4 kg)   BMI 39.69 kg/m    Wt Readings from Last 3 Encounters:  08/10/23 217 lb (98.4 kg)  06/29/23 218 lb (98.9 kg)  04/29/23 217 lb (98.4 kg)    GEN: Well nourished, well developed in no acute distress NECK: No JVD; No carotid bruits CARDIAC: IRIR, tachycardic, no murmurs, rubs, gallops RESPIRATORY:  Clear to auscultation without rales, wheezing or rhonchi  ABDOMEN: Soft, non-tender, non-distended EXTREMITIES: No deformity; Bilateral 1+  pitting lower extremity edema  ASSESSMENT AND PLAN: .    Persistent atrial fibrillation / Hypercoagulable state - EKG afib RVR, stable QTc 471. Also notes vomiting this morning, not feeling well consistent with vertigo which could contribute to elevated rate. Symptomatic with fluid retention, dyspnea.  She is interested in pursuing cardioversion. No missed doses of OAC over the last three weeks. CBC, mag, BMP today.  CHA2DS2-VASc Score = 4 [CHF History: 1, HTN History: 1, Diabetes History: 0, Stroke History: 0, Vascular Disease History: 0, Age Score: 1, Gender Score: 1].  Therefore, the patient's annual risk of stroke is 4.8 %.    Denies bleeding complications on Eliquis 5mg  BID.  Informed Consent   Shared Decision Making/Informed Consent The risks (stroke, cardiac arrhythmias rarely resulting in the need for a temporary or permanent pacemaker, skin irritation or burns and complications associated with conscious sedation including aspiration, arrhythmia, respiratory failure and death), benefits (restoration of normal sinus rhythm) and alternatives of a direct current cardioversion were explained in detail to Ms. Anita Stanton and she agrees to proceed.      Combined systolic and diastolic heart failure -  Recovered LVEF by echo 11/2020. LE edema noted on exam. Anticipate volume status exacerbated by atrial fib with RVR. Recommend she take her PRN Lasix for 2-3 days then return to PRN.   HTN - BP well controlled at home. Continue present regimen.         Dispo: follow up 2 weeks after cardioversion  Signed, Alver Sorrow, NP

## 2023-08-10 NOTE — Patient Instructions (Signed)
Medication Instructions:  Your physician recommends that you continue on your current medications as directed. Please refer to the Current Medication list given to you today.  *If you need a refill on your cardiac medications before your next appointment, please call your pharmacy*   Lab Work: Your physician recommends that you return for lab work today BMP, CBC and Mag  If you have labs (blood work) drawn today and your tests are completely normal, you will receive your results only by: MyChart Message (if you have MyChart) OR A paper copy in the mail If you have any lab test that is abnormal or we need to change your treatment, we will call you to review the results.   Testing/Procedures: You are scheduled for a Cardioversion on Monday, August 19 with Dr. Jacques Navy.  Please arrive at the [facility] (Main Entrance A) at [facility]: [address] [place], [place] [address] at 9:30 AM (This time is 2 hour(s) before your procedure to ensure your preparation). Free valet parking service is available. You will check in at ADMITTING. The support person will be asked to wait in the waiting room.  It is OK to have someone drop you off and come back when you are ready to be discharged.      DIET:  Nothing to eat or drink after midnight except a sip of water with medications (see medication instructions below)  MEDICATION INSTRUCTIONS: !!IF ANY NEW MEDICATIONS ARE STARTED AFTER TODAY, PLEASE NOTIFY YOUR PROVIDER AS SOON AS POSSIBLE!!  You will need to continue this after your procedure until you are told by your provider that it is safe to stop.    LABS: done at [facility] 8/12  FYI:  For your safety, and to allow [place] to monitor your vital signs accurately during the surgery/procedure we request: If you have artificial nails, gel coating, SNS etc, please have those removed prior to your surgery/procedure. Not having the nail coverings /polish removed may result in cancellation or delay of your  surgery/procedure.  You must have a responsible person to drive you home and stay in the waiting area during your procedure. Failure to do so could result in cancellation.  Bring your insurance cards.  *Special Note: Every effort is made to have your procedure done on time. Occasionally there are emergencies that occur at the hospital that may cause delays. Please be patient if a delay does occur.    Follow-Up: At [facility], you and your health needs are our priority.  As part of our continuing mission to provide you with exceptional heart care, we have created designated Provider Care Teams.  These Care Teams include your primary Cardiologist (physician) and Advanced Practice Providers (APPs -  Physician Assistants and Nurse Practitioners) who all work together to provide you with the care you need, when you need it.  We recommend signing up for the patient portal called "MyChart".  Sign up information is provided on this After Visit Summary.  MyChart is used to connect with patients for Virtual Visits (Telemedicine).  Patients are able to view lab/test results, encounter notes, upcoming appointments, etc.  Non-urgent messages can be sent to your provider as well.   To learn more about what you can do with MyChart, go to [email or web address].    Your next appointment:   2 weeks post cardioversion with Dr. Cristal Deer or Gillian Shields, NP

## 2023-08-14 NOTE — Progress Notes (Signed)
 Spoke to pt and instructed them to come at 0915 and to be NPO after 0000. Confirmed no missed doses of AC and instructed to take in AM with a small sip of water.   Confirmed that pt will have a ride home and someone to stay with them for 24 hours after the procedure.

## 2023-08-17 ENCOUNTER — Ambulatory Visit (HOSPITAL_BASED_OUTPATIENT_CLINIC_OR_DEPARTMENT_OTHER): Payer: Medicare Other | Admitting: Anesthesiology

## 2023-08-17 ENCOUNTER — Ambulatory Visit (HOSPITAL_COMMUNITY)
Admission: RE | Admit: 2023-08-17 | Discharge: 2023-08-17 | Disposition: A | Discharge status: Home / Self Care | Payer: Medicare Other | Source: Ambulatory Visit | Attending: Internal Medicine | Admitting: Internal Medicine

## 2023-08-17 ENCOUNTER — Ambulatory Visit (HOSPITAL_COMMUNITY): Payer: Medicare Other | Admitting: Anesthesiology

## 2023-08-17 ENCOUNTER — Other Ambulatory Visit: Payer: Self-pay

## 2023-08-17 ENCOUNTER — Encounter (HOSPITAL_COMMUNITY)
Admission: RE | Disposition: A | Discharge status: Home / Self Care | Payer: Self-pay | Source: Ambulatory Visit | Attending: Internal Medicine

## 2023-08-17 DIAGNOSIS — I083 Combined rheumatic disorders of mitral, aortic and tricuspid valves: Secondary | ICD-10-CM | POA: Diagnosis not present

## 2023-08-17 DIAGNOSIS — Z6841 Body Mass Index (BMI) 40.0 and over, adult: Secondary | ICD-10-CM | POA: Diagnosis not present

## 2023-08-17 DIAGNOSIS — I4891 Unspecified atrial fibrillation: Secondary | ICD-10-CM

## 2023-08-17 DIAGNOSIS — I5042 Chronic combined systolic (congestive) and diastolic (congestive) heart failure: Secondary | ICD-10-CM | POA: Diagnosis not present

## 2023-08-17 DIAGNOSIS — D6859 Other primary thrombophilia: Secondary | ICD-10-CM | POA: Diagnosis not present

## 2023-08-17 DIAGNOSIS — I10 Essential (primary) hypertension: Secondary | ICD-10-CM | POA: Diagnosis not present

## 2023-08-17 DIAGNOSIS — I4811 Longstanding persistent atrial fibrillation: Secondary | ICD-10-CM

## 2023-08-17 DIAGNOSIS — I110 Hypertensive heart disease with heart failure: Secondary | ICD-10-CM | POA: Diagnosis not present

## 2023-08-17 DIAGNOSIS — I4819 Other persistent atrial fibrillation: Secondary | ICD-10-CM | POA: Diagnosis present

## 2023-08-17 DIAGNOSIS — Z7901 Long term (current) use of anticoagulants: Secondary | ICD-10-CM | POA: Insufficient documentation

## 2023-08-17 HISTORY — PX: CARDIOVERSION: SHX1299

## 2023-08-17 SURGERY — CARDIOVERSION
Anesthesia: General

## 2023-08-17 MED ORDER — SODIUM CHLORIDE 0.9 % IV SOLN: INTRAVENOUS | Status: DC

## 2023-08-17 MED ORDER — PROPOFOL 10 MG/ML IV BOLUS
INTRAVENOUS | Status: DC | PRN
  Administered 2023-08-17: 60 mg via INTRAVENOUS

## 2023-08-17 MED ORDER — LIDOCAINE 2% (20 MG/ML) 5 ML SYRINGE
INTRAMUSCULAR | Status: DC | PRN
  Administered 2023-08-17: 60 mg via INTRAVENOUS

## 2023-08-17 SURGICAL SUPPLY — 1 items: ELECT DEFIB PAD ADLT CADENCE (PAD) ×2 IMPLANT

## 2023-08-17 NOTE — Anesthesia Postprocedure Evaluation (Signed)
Anesthesia Post Note  Patient: Anita Stanton  Procedure(s) Performed: CARDIOVERSION     Patient location during evaluation: PACU Anesthesia Type: General Level of consciousness: awake and alert, oriented and patient cooperative Pain management: pain level controlled Vital Signs Assessment: post-procedure vital signs reviewed and stable Respiratory status: spontaneous breathing, nonlabored ventilation and respiratory function stable Cardiovascular status: blood pressure returned to baseline and stable Postop Assessment: no apparent nausea or vomiting Anesthetic complications: no   No notable events documented.  Last Vitals:  Vitals:   08/17/23 1020 08/17/23 1030  BP: (!) 152/97 (!) 152/103  Pulse: 67 66  Resp: 20 19  Temp:  36.7 C  SpO2: 99% 100%    Last Pain:  Vitals:   08/17/23 1030  TempSrc: Temporal  PainSc: 0-No pain                 Lannie Fields

## 2023-08-17 NOTE — Transfer of Care (Signed)
Immediate Anesthesia Transfer of Care Note  Patient: Anita Stanton  Procedure(s) Performed: CARDIOVERSION  Patient Location: Cath Lab  Anesthesia Type:General  Level of Consciousness: drowsy and patient cooperative  Airway & Oxygen Therapy: Patient Spontanous Breathing and Patient connected to nasal cannula oxygen  Post-op Assessment: Report given to RN and Post -op Vital signs reviewed and stable  Post vital signs: Reviewed and stable  Last Vitals:  Vitals Value Taken Time  BP    Temp    Pulse 108 08/17/23 0958  Resp 13 08/17/23 0958  SpO2 97 % 08/17/23 0958  Vitals shown include unfiled device data.  Last Pain: There were no vitals filed for this visit.       Complications: No notable events documented.

## 2023-08-17 NOTE — CV Procedure (Signed)
Procedure: Electrical Cardioversion Indications:  Atrial Fibrillation  Procedure Details:  Consent: Risks of procedure as well as the alternatives and risks of each were explained to the (patient/caregiver).  Consent for procedure obtained.  Time Out: Verified patient identification, verified procedure, site/side was marked, verified correct patient position, special equipment/implants available, medications/allergies/relevent history reviewed, required imaging and test results available. PERFORMED.  Patient placed on cardiac monitor, pulse oximetry, supplemental oxygen as necessary.  Sedation given:  propofol per anesthesia Pacer pads placed anterior and posterior chest.  Cardioverted 1 time(s).  Cardioversion with synchronized biphasic 120J shock.  Evaluation: Findings: Post procedure EKG shows: NSR Complications: None Patient did tolerate procedure well.  Time Spent Directly with the Patient:  20 minutes   Parke Poisson 08/17/2023, 10:13 AM

## 2023-08-17 NOTE — Interval H&P Note (Signed)
History and Physical Interval Note:  08/17/2023 9:58 AM  Anita Stanton  has presented today for surgery, with the diagnosis of AFIB.  The various methods of treatment have been discussed with the patient and family. After consideration of risks, benefits and other options for treatment, the patient has consented to  Procedure(s): CARDIOVERSION (N/A) as a surgical intervention.  The patient's history has been reviewed, patient examined, no change in status, stable for surgery.  I have reviewed the patient's chart and labs.  Questions were answered to the patient's satisfaction.     Parke Poisson

## 2023-08-17 NOTE — Anesthesia Preprocedure Evaluation (Signed)
Anesthesia Evaluation  Patient identified by MRN, date of birth, ID band Patient awake    Reviewed: Allergy & Precautions, NPO status , Patient's Chart, lab work & pertinent test results  Airway Mallampati: III  TM Distance: >3 FB Neck ROM: Full    Dental  (+) Teeth Intact, Dental Advisory Given   Pulmonary neg pulmonary ROS   Pulmonary exam normal breath sounds clear to auscultation       Cardiovascular hypertension (145/117), Pt. on medications Normal cardiovascular exam+ dysrhythmias (eliquis) Atrial Fibrillation  Rhythm:Regular Rate:Normal     Neuro/Psych negative neurological ROS  negative psych ROS   GI/Hepatic negative GI ROS, Neg liver ROS,,,  Endo/Other    Morbid obesityBMI 40   Renal/GU negative Renal ROS  negative genitourinary   Musculoskeletal negative musculoskeletal ROS (+)    Abdominal  (+) + obese  Peds  Hematology negative hematology ROS (+)   Anesthesia Other Findings   Reproductive/Obstetrics negative OB ROS                             Anesthesia Physical Anesthesia Plan  ASA: 3  Anesthesia Plan: General   Post-op Pain Management:    Induction: Intravenous  PONV Risk Score and Plan: TIVA and Treatment may vary due to age or medical condition  Airway Management Planned: Natural Airway and Mask  Additional Equipment: None  Intra-op Plan:   Post-operative Plan:   Informed Consent: I have reviewed the patients History and Physical, chart, labs and discussed the procedure including the risks, benefits and alternatives for the proposed anesthesia with the patient or authorized representative who has indicated his/her understanding and acceptance.       Plan Discussed with: CRNA  Anesthesia Plan Comments:        Anesthesia Quick Evaluation

## 2023-08-18 ENCOUNTER — Encounter (HOSPITAL_COMMUNITY): Payer: Self-pay | Admitting: Internal Medicine

## 2023-09-01 ENCOUNTER — Encounter (HOSPITAL_BASED_OUTPATIENT_CLINIC_OR_DEPARTMENT_OTHER): Payer: Self-pay | Admitting: Family

## 2023-09-01 ENCOUNTER — Ambulatory Visit (INDEPENDENT_AMBULATORY_CARE_PROVIDER_SITE_OTHER): Payer: Medicare Other | Admitting: Family

## 2023-09-01 VITALS — BP 130/82 | HR 86 | Ht 62.0 in | Wt 217.0 lb

## 2023-09-01 DIAGNOSIS — I5042 Chronic combined systolic (congestive) and diastolic (congestive) heart failure: Secondary | ICD-10-CM | POA: Diagnosis not present

## 2023-09-01 DIAGNOSIS — D6859 Other primary thrombophilia: Secondary | ICD-10-CM | POA: Diagnosis not present

## 2023-09-01 DIAGNOSIS — I4811 Longstanding persistent atrial fibrillation: Secondary | ICD-10-CM

## 2023-09-01 DIAGNOSIS — I10 Essential (primary) hypertension: Secondary | ICD-10-CM

## 2023-09-01 DIAGNOSIS — Z8679 Personal history of other diseases of the circulatory system: Secondary | ICD-10-CM | POA: Diagnosis not present

## 2023-09-01 NOTE — Progress Notes (Signed)
Cardiology Office Note:  .   Date:  09/01/2023  ID:  Anita Stanton, DOB [birth year], MRN [id number] PCP: Aviva Kluver  [facility] Providers Cardiologist:  Jodelle Red, MD    History of Present Illness: .   Anita Stanton is a 68 y.o. female with hx of atrial fibrillation, cardiomyopathy, chronic systolic and diastolic heart failure.    Admitted 05/2020 new onset cardiomyopathy, afib RVR. Declined cath/ischemic workup. Declined TEE-CV. Preferred outpatient DCCV performed 07/09/20. Returned to atrial fib several days later. Seen by Dr. Johney Frame 08/06/20 for discussion on tikosyn vs ablation. Has been on tikosyn since then.    Phone visit with Dr. Cristal Deer 06/29/23 noting being in atrial fib majority of time since 12/2022. Continued to discuss cardioversion. She noted swelling in legs, heart rate 80-110s at home. She wished to consider DCCV and leave ablation for last result.    Seen 08/10/2023 feeling poorly in the setting of vertigo with nausea.  She was then A-fib RVR with rate 113 bpm.  Schedule for cardioversion performed 08/17/2023.  Presents today for follow up. Notes she felt wonderful immediately after her cardioversion. Then about 8 hours later was in atrial fibrillation. Notes she does not feel quite as good as she did when in normal rhythm but does feel better than her heart rate was not well-controlled. She backed off on the natokinase OTC some to see if it would impact how her tikosyn would work without it. Does attribute it to lowering her blood pressure. Taking 60mg  Diltiazem at night and 30mg  in the daytime. Does not feel as tired as previously when taking it during the daytime. Metoprolol previously stopped due to LLE numbness and fatigue. 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 Interested in pursuing cardioversion.   ROS: Please see the history of present illness.    All other systems reviewed and are negative.  Studies Reviewed: .        Cardiac Studies &  Procedures       ECHOCARDIOGRAM  ECHOCARDIOGRAM COMPLETE 12/07/2020  Narrative ECHOCARDIOGRAM REPORT    Patient Name:   Anita Stanton Date of Exam: 12/07/2020 Medical Rec #:  [id number]           Height:       62.0 in Accession #:    [id number]          Weight:       212.4 lb Date of Birth:  08-26-1956           BSA:          1.961 m Patient Age:    64 years            BP:           136/98 mmHg Patient Gender: F                   HR:           71 bpm. Exam Location:  [address]  Procedure: 2D Echo, Cardiac Doppler and Color Doppler  Indications:    I42.9 Cardiomyopathy (unspecified)  History:        Patient has prior history of Echocardiogram examinations, most recent 06/10/2020. Cardiomyopathy and CHF, Arrythmias:Atrial Fibrillation and Obesity; Risk Factors:Hypertension.  Sonographer:    Samule Ohm RDCS Referring Phys: [id number] Anita Stanton  IMPRESSIONS   1. Left ventricular ejection fraction, by estimation, is 60 to 65%. The left ventricle has normal function. The left ventricle has no regional wall motion abnormalities. There  is moderate asymmetric left ventricular hypertrophy of the septal segment. Left ventricular diastolic parameters are consistent with Grade I diastolic dysfunction (impaired relaxation). Elevated left ventricular end-diastolic pressure. 2. Right ventricular systolic function is normal. The right ventricular size is normal. Tricuspid regurgitation signal is inadequate for assessing PA pressure. 3. The mitral valve is normal in structure. Mild mitral valve regurgitation. No evidence of mitral stenosis. Moderate mitral annular calcification. 4. The aortic valve is tricuspid. Aortic valve regurgitation is not visualized. Mild aortic valve sclerosis is present, with no evidence of aortic valve stenosis. 5. Aortic dilatation noted. There is mild dilatation of the ascending aorta, measuring 41 mm. 6. The inferior vena cava is normal in  size with greater than 50% respiratory variability, suggesting right atrial pressure of 3 mmHg.  FINDINGS Left Ventricle: Left ventricular ejection fraction, by estimation, is 60 to 65%. The left ventricle has normal function. The left ventricle has no regional wall motion abnormalities. The left ventricular internal cavity size was normal in size. There is moderate asymmetric left ventricular hypertrophy of the septal segment. Left ventricular diastolic parameters are consistent with Grade I diastolic dysfunction (impaired relaxation). Elevated left ventricular end-diastolic pressure.  Right Ventricle: The right ventricular size is normal. No increase in right ventricular wall thickness. Right ventricular systolic function is normal. Tricuspid regurgitation signal is inadequate for assessing PA pressure.  Left Atrium: Left atrial size was normal in size.  Right Atrium: Right atrial size was normal in size.  Pericardium: There is no evidence of pericardial effusion.  Mitral Valve: The mitral valve is normal in structure. Moderate mitral annular calcification. Mild mitral valve regurgitation. No evidence of mitral valve stenosis.  Tricuspid Valve: The tricuspid valve is normal in structure. Tricuspid valve regurgitation is not demonstrated. No evidence of tricuspid stenosis.  Aortic Valve: The aortic valve is tricuspid. Aortic valve regurgitation is not visualized. Mild aortic valve sclerosis is present, with no evidence of aortic valve stenosis.  Pulmonic Valve: The pulmonic valve was normal in structure. Pulmonic valve regurgitation is trivial. No evidence of pulmonic stenosis.  Aorta: Aortic dilatation noted. There is mild dilatation of the ascending aorta, measuring 41 mm.  Venous: The inferior vena cava is normal in size with greater than 50% respiratory variability, suggesting right atrial pressure of 3 mmHg.  IAS/Shunts: No atrial level shunt detected by color flow Doppler.   LEFT  VENTRICLE PLAX 2D LVIDd:         5.10 cm  Diastology LVIDs:         3.40 cm  LV e' medial:    4.35 cm/s LV PW:         1.00 cm  LV E/e' medial:  18.0 LV IVS:        1.40 cm  LV e' lateral:   6.53 cm/s LVOT diam:     1.90 cm  LV E/e' lateral: 12.0 LV SV:         71 LV SV Index:   36 LVOT Area:     2.84 cm   RIGHT VENTRICLE             IVC RV S prime:     13.80 cm/s  IVC diam: 1.30 cm TAPSE (M-mode): 1.4 cm  LEFT ATRIUM             Index       RIGHT ATRIUM           Index LA diam:        4.20 cm  2.14 cm/m  RA Pressure: 3.00 mmHg LA Vol (A2C):   55.9 ml 28.50 ml/m RA Area:     13.10 cm LA Vol (A4C):   60.0 ml 30.59 ml/m RA Volume:   26.30 ml  13.41 ml/m LA Biplane Vol: 59.5 ml 30.34 ml/m AORTIC VALVE LVOT Vmax:   105.00 cm/s LVOT Vmean:  70.400 cm/s LVOT VTI:    0.249 m  AORTA Ao Root diam: 3.45 cm Ao Asc diam:  4.10 cm  MV E velocity: 78.50 cm/s   TRICUSPID VALVE MV A velocity: 114.00 cm/s  Estimated RAP:  3.00 mmHg MV E/A ratio:  0.69 SHUNTS Systemic VTI:  0.25 m Systemic Diam: 1.90 cm  Armanda Magic MD Electronically signed by Armanda Magic MD Signature Date/Time: 12/07/2020/5:58:30 PM    Final             Risk Assessment/Calculations:    CHA2DS2-VASc Score = 4   This indicates a 4.8% annual risk of stroke. The patient's score is based upon: CHF History: 1 HTN History: 1 Diabetes History: 0 Stroke History: 0 Vascular Disease History: 0 Age Score: 1 Gender Score: 1            Physical Exam:   VS:  BP 130/82   Pulse 86   Ht 5\' 2"  (1.575 m)   Wt 217 lb (98.4 kg)   SpO2 98%   BMI 39.69 kg/m    Wt Readings from Last 3 Encounters:  09/01/23 217 lb (98.4 kg)  08/10/23 217 lb (98.4 kg)  06/29/23 218 lb (98.9 kg)    GEN: Well nourished, overweight, well developed in no acute distress NECK: No JVD; No carotid bruits CARDIAC: IRIR, no murmurs, rubs, gallops RESPIRATORY:  Clear to auscultation without rales, wheezing or rhonchi  ABDOMEN: Soft,  non-tender, non-distended EXTREMITIES:  No edema; No deformity   ASSESSMENT AND PLAN: .    PAF / Hypercoagulable state - Return of atrial fibrillation 8 hours after cardioversion 08/17/23. Despite taking Tikosyn as prescribed, quick return of afib after cardioversion. Will set up for follow up with Atrial Fib clinic for next steps. She prefers changes to medication versus ablation. Discussed Amiodarone, she has concerns about side effects. Also discussed Affirm Trial and that rate control could be pursued if she feels better with controlled rate.   Combined systolic and diastolic heart failure - Echo 01/9561 LVEF 25% ? echo 11/2020 LVEF 60 to 65%,gr1dd. Euvolemic and well compensated on exam. Continue Furosemide 40mg  PRN, Losartan 100mg  daily. Dyspnea improved since taking PRN Lasix on a more regular basis. Prior intolerance to Metoprolol with fatigue. Low sodium diet, fluid restriction <2L, and daily weights encouraged. Educated to contact our office for weight gain of 2 lbs overnight or 5 lbs in one week.    HTN - BP well controlled. Continue current antihypertensive regimen.         Dispo: follow up with A-fib clinic within 1 month and Dr. Cristal Deer in 6 months.  Signed, Alver Sorrow, NP

## 2023-09-01 NOTE — Patient Instructions (Addendum)
Medication Instructions:  Continue your current medications.   *If you need a refill on your cardiac medications before your next appointment, please call your pharmacy*  Testing/Procedures: Your EKG today showed rate controlled atrial fibrillation.   Follow-Up: At [facility], you and your health needs are our priority.  As part of our continuing mission to provide you with exceptional heart care, we have created designated Provider Care Teams.  These Care Teams include your primary Cardiologist (physician) and Advanced Practice Providers (APPs -  Physician Assistants and Nurse Practitioners) who all work together to provide you with the care you need, when you need it.  We recommend signing up for the patient portal called "MyChart".  Sign up information is provided on this After Visit Summary.  MyChart is used to connect with patients for Virtual Visits (Telemedicine).  Patients are able to view lab/test results, encounter notes, upcoming appointments, etc.  Non-urgent messages can be sent to your provider as well.   To learn more about what you can do with MyChart, go to [email or web address].    Your next appointment:   With the EP team or the atrial fibrillation clinic within the next month AND In 6 months with Dr. Cristal Deer

## 2023-09-22 ENCOUNTER — Ambulatory Visit (HOSPITAL_COMMUNITY)
Admission: RE | Admit: 2023-09-22 | Discharge: 2023-09-22 | Disposition: A | Discharge status: Home / Self Care | Payer: Medicare Other | Source: Ambulatory Visit | Attending: Internal Medicine | Admitting: Internal Medicine

## 2023-09-22 VITALS — HR 132 | Ht 62.0 in | Wt 212.0 lb

## 2023-09-22 DIAGNOSIS — Z79899 Other long term (current) drug therapy: Secondary | ICD-10-CM | POA: Diagnosis not present

## 2023-09-22 DIAGNOSIS — Z7182 Exercise counseling: Secondary | ICD-10-CM | POA: Diagnosis not present

## 2023-09-22 DIAGNOSIS — I5022 Chronic systolic (congestive) heart failure: Secondary | ICD-10-CM | POA: Diagnosis not present

## 2023-09-22 DIAGNOSIS — I4892 Unspecified atrial flutter: Secondary | ICD-10-CM | POA: Diagnosis not present

## 2023-09-22 DIAGNOSIS — Z6838 Body mass index (BMI) 38.0-38.9, adult: Secondary | ICD-10-CM | POA: Diagnosis not present

## 2023-09-22 DIAGNOSIS — D6869 Other thrombophilia: Secondary | ICD-10-CM | POA: Insufficient documentation

## 2023-09-22 DIAGNOSIS — I480 Paroxysmal atrial fibrillation: Secondary | ICD-10-CM

## 2023-09-22 DIAGNOSIS — I4819 Other persistent atrial fibrillation: Secondary | ICD-10-CM | POA: Insufficient documentation

## 2023-09-22 DIAGNOSIS — E669 Obesity, unspecified: Secondary | ICD-10-CM | POA: Insufficient documentation

## 2023-09-22 DIAGNOSIS — Z7901 Long term (current) use of anticoagulants: Secondary | ICD-10-CM | POA: Diagnosis not present

## 2023-09-22 DIAGNOSIS — I110 Hypertensive heart disease with heart failure: Secondary | ICD-10-CM | POA: Diagnosis not present

## 2023-09-22 NOTE — Progress Notes (Signed)
Primary Care Physician: Pcp, No Primary Cardiologist: Dr Cristal Deer Primary Electrophysiologist: Dr Johney Frame Referring Physician: Dr Johney Frame    Anita Stanton is a 67 y.o. female with a history of persistent atrial fibrillation, chronic systolic dysfunction, HTN who presents to the [facility]rial [facility]ibr[facility] for follow up. She was initially diagnosed with afib 06/09/20 after presenting with AF with RVR and decompensated CHF (EF 25%).  She was started on eliquis and toprol. She was cardioverted 07/09/20.  Unfortunately, she returned to afib several days later. She was seen by Dr Johney Frame who recommended dofetilide admission. Patient reports she wanted to consider her options before making a decision. Patient is on Eliquis for a CHADS2VASC score of 4.  On follow up today, patient is s/p dofetilide admission 10/12-10/15/21. She converted to SR after the first dose and had a 9 second post termination pause. Her BB and CCB were stopped. Her QT lengthened and her dofetilide dose was decreased. Unfortunately, she reverted back to atrial flutter.   On follow up today, patient has continued to go in and out of afib. She brings in her Lourena Simmonds strips which show she has had rapid afib at least once daily since leaving the hospital. She is in SR today.   On follow up 09/22/23, patient is currently in atrial flutter with RVR. Seen by Cardiology on 8/12 and in symptomatic Afib. She underwent successful DCCV on 08/17/23 but on f/u with Cardiology 9/3 noted her sinus rhythm lasted less than 1 day. She appears to be compliant with Tikosyn 125 mcg BID. No bleeding issues with Eliquis.   Today, she denies symptoms of chest pain, shortness of breath, orthopnea, PND, lower extremity edema, dizziness, presyncope, syncope, bleeding, or neurologic sequela. The patient is tolerating medications without difficulties and is otherwise without complaint today.    Atrial Fibrillation Risk Factors:  she does have  symptoms or diagnosis of sleep apnea. Patient declined sleep study. she does not have a history of rheumatic fever.   she has a BMI of Body mass index is 38.78 kg/m.Marland Kitchen Filed Weights   09/22/23 1058  Weight: 96.2 kg     Family History  Problem Relation Age of Onset   Atrial fibrillation Mother    Cancer Mother     Atrial Fibrillation Management history:  Previous antiarrhythmic drugs: dofetilide Previous cardioversions: 07/09/20, 08/17/23 Previous ablations: none CHADS2VASC score: 4 Anticoagulation history: Eliquis   Past Medical History:  Diagnosis Date   Atrial fibrillation (HCC) 10/09/2020   Cardiomyopathy (HCC)    Hypertension    Obesity    Persistent atrial fibrillation (HCC)    Past Surgical History:  Procedure Laterality Date   CARDIOVERSION N/A 07/09/2020   Procedure: CARDIOVERSION;  Surgeon: Pricilla Riffle, MD;  Location[facility] ENDOSCOPY;  Service: Cardiovascular;  Laterality: N/A;   CARDIOVERSION N/A 08/17/2023   Procedure: CARDIOVERSION;  Surgeon: Parke Poisson, MD;  Location: [facility] INVASIVE CV LAB;  Service: Cardiovascular;  Laterality: N/A;   TONSILECTOMY, ADENOIDECTOMY, BILATERAL MYRINGOTOMY AND TUBES N/A     Current Outpatient Medications  Medication Sig Dispense Refill   5-Hydroxytryptophan (5-HTP PO) Take 1 tablet by mouth daily.     ALPHA LIPOIC ACID PO Take 100 mg by mouth at bedtime.     apixaban (ELIQUIS) 5 MG TABS tablet Take 1 tablet (5 mg total) by mouth 2 (two) times daily. 180 tablet 3   Ascorbic Acid (VITAMIN C) 1000 MG tablet Take 1,000 mg by mouth 4 (four) times a week.  b complex vitamins capsule Take 1 capsule by mouth daily.     Coenzyme Q10 (COQ10) 100 MG CAPS Take 100 mg by mouth daily.     Cyanocobalamin (VITAMIN B-12) 2500 MCG SUBL Place 2,500 mcg under the tongue daily.     D-Ribose POWD Take 5 capsules by mouth 2 (two) times a week.     diltiazem (CARDIZEM SR) 60 MG 12 hr capsule Take 1 capsule (60 mg total) by mouth 2 (two) times  daily. (Patient taking differently: Take 60 mg by mouth at bedtime.) 180 capsule 3   diltiazem (CARDIZEM) 30 MG tablet Take 1 tablet (30 mg total) by mouth 2 (two) times daily as needed (heart rate greater than 110). 90 tablet 3   dofetilide (TIKOSYN) 125 MCG capsule Take 1 capsule (125 mcg total) by mouth 2 (two) times daily. Please schedule appointment with Dr. Cristal Deer for refills. 180 capsule 3   furosemide (LASIX) 40 MG tablet Take 1 tablet (40 mg total) by mouth daily as needed for fluid. (Patient taking differently: Take 40 mg by mouth 3 (three) times a week.) 90 tablet 2   Lactobacillus (PROBIOTIC ACIDOPHILUS) CAPS Take 1 capsule by mouth every 12 (twelve) hours. Sometimes takes liquid form     losartan (COZAAR) 100 MG tablet TAKE 1 TABLET BY MOUTH EVERY DAY 90 tablet 2   Magnesium 100 MG TABS Take 100 mg by mouth at bedtime.      melatonin 3 MG TABS tablet Take 3 mg by mouth at bedtime.     NATTOKINASE PO Take 2,000 Units by mouth daily.     OVER THE COUNTER MEDICATION Apply 1 application topically 2 (two) times a week. Vit D3 cream/anumed/vitamin K- Apply 1 squirt (5,000 units) to the skin once a day in the Summer and 1 squirt (5,000 units) two times a day in the Winter     ZINC GLUCONATE PO Take 10 mg by mouth 2 (two) times a week.     No current facility-administered medications for this encounter.    Allergies  Allergen Reactions   Metoprolol     Patient reported "numbness, tingling, itching"    ROS- All systems are reviewed and negative except as per the HPI above.  Physical Exam: Vitals:   09/22/23 1058  Pulse: (!) 132  Weight: 96.2 kg  Height: 5\' 2"  (1.575 m)    GEN- The patient is well appearing, alert and oriented x 3 today.   Neck - no JVD or carotid bruit noted Lungs- Clear to ausculation bilaterally, normal work of breathing Heart- Tachycardic irregular rate and rhythm, no murmurs, rubs or gallops, PMI not laterally displaced Extremities- no clubbing,  cyanosis, or edema Skin - no rash or ecchymosis noted   Wt Readings from Last 3 Encounters:  09/22/23 96.2 kg  09/01/23 98.4 kg  08/10/23 98.4 kg    EKG today demonstrates Vent. rate 132 BPM PR interval * ms QRS duration 86 ms QT/QTcB 326/483 ms P-R-T axes * -73 51 Atrial flutter with variable A-V block Pulmonary disease pattern Left anterior fascicular block Nonspecific ST and T wave abnormality Abnormal ECG When compared with ECG of 01-Sep-2023 11:16, PREVIOUS ECG IS PRESENT  Echo 12/07/20 demonstrated   1. Left ventricular ejection fraction, by estimation, is 60 to 65%. The  left ventricle has normal function. The left ventricle has no regional  wall motion abnormalities. There is moderate asymmetric left ventricular  hypertrophy of the septal segment.  Left ventricular diastolic parameters are consistent with Grade  I  diastolic dysfunction (impaired relaxation). Elevated left ventricular  end-diastolic pressure.   2. Right ventricular systolic function is normal. The right ventricular  size is normal. Tricuspid regurgitation signal is inadequate for assessing  PA pressure.   3. The mitral valve is normal in structure. Mild mitral valve  regurgitation. No evidence of mitral stenosis. Moderate mitral annular  calcification.   4. The aortic valve is tricuspid. Aortic valve regurgitation is not  visualized. Mild aortic valve sclerosis is present, with no evidence of  aortic valve stenosis.   5. Aortic dilatation noted. There is mild dilatation of the ascending  aorta, measuring 41 mm.   6. The inferior vena cava is normal in size with greater than 50%  respiratory variability, suggesting right atrial pressure of 3 mmHg.    Epic records are reviewed at length today  CHA2DS2-VASc Score = 4  The patient's score is based upon: CHF History: 1 HTN History: 1 Diabetes History: 0 Stroke History: 0 Vascular Disease History: 0 Age Score: 1 Gender Score: 1       ASSESSMENT AND PLAN: 1. Persistent Atrial Fibrillation/atrial flutter The patient's CHA2DS2-VASc score is 4, indicating a 4.8% annual risk of stroke.   S/p dofetilide loading 10/12-10/15/21.  She is currently in atrial flutter with RVR.  We had a long discussion about therapeutic options today. We discussed changing to amiodarone. Patient is very hesitant to begin amiodarone secondary to concern about potential side effects. We discussed that uncontrolled afib is likely contributing to her heart failure and good control is essential.  We also discussed ablation as a procedure, worth a conversation if patient is a candidate despite Tikosyn 125 mcg BID failure, and went over benefit vs risk of procedure.  Unfortunately, she declines any change in medication regimen or intervention at this point. She tells me she would like to research amiodarone more before making a decision to switch medications. She also seems hesitant to proceed with ablation, if a candidate for procedure.  May use diltiazem 60 mg PRN BID for heart rate >100.  2. Secondary Hypercoagulable State (ICD10:  D68.69) The patient is at significant risk for stroke/thromboembolism based upon her CHA2DS2-VASc Score of 4.  Continue Apixaban (Eliquis).   3. Obesity Body mass index is 38.78 kg/m. Lifestyle modification was discussed and encouraged including regular physical activity and weight reduction.  4. Chronic systolic CHF Improved after repeat echo from June to December 2021.   5. HTN Stable considering fast arrhythmia; no changes today.   Patient will call if/when she chooses to proceed with medication change.    Justin Mend, PA-C [facility] [address] [place], [place] [address] [phone] 09/22/2023 1:34 PM

## 2023-09-28 ENCOUNTER — Encounter (HOSPITAL_BASED_OUTPATIENT_CLINIC_OR_DEPARTMENT_OTHER): Payer: Self-pay

## 2023-09-29 ENCOUNTER — Telehealth (HOSPITAL_COMMUNITY): Payer: Self-pay | Admitting: *Deleted

## 2023-09-29 DIAGNOSIS — I4819 Other persistent atrial fibrillation: Secondary | ICD-10-CM

## 2023-09-29 NOTE — Telephone Encounter (Signed)
Patient would like to discuss afib ablation with EP. Referral placed.

## 2023-10-15 NOTE — Progress Notes (Signed)
Electrophysiology Office Note:   Date:  10/16/2023  ID:  Minami, Ancelet [birth year], MRN [id number]  Primary Cardiologist: Jodelle Red, MD Electrophysiologist: None      History of Present Illness:   Anita Stanton is a 67 y.o. female with h/o persistent atrial fibrillation, chronic systolic and diastolic heart failure likely secondary to ischemic cardiomyopathy (although ischemic evaluation never done), HTN seen today for Electrophysiology evaluation of atrial fibrillation and flutter at the request of Jodelle Red, MD.   Patient was initially diagnosed with atrial fibrillation in June 2021.  At that time she was found to have new onset systolic heart failure and atrial fibrillation with rapid ventricular response.  Patient underwent cardioversion on 07/09/2020.  She had recurrence of atrial fibrillation several days later.  She underwent admission for Tikosyn loading in October 2021.  She has been on Tikosyn ever since.  Since January 2024, she has been having more episodes of atrial fibrillation.  This has been complicated by decompensated heart failure.  She recently underwent successful cardioversion on 08/17/2023. But unfortunately went back into atrial fibrillation the next day.  Currently, she reports feeling fatigued. She has exertional dyspnea, PND, orthopnea and lower extremity edema. Symptoms are helped by taking lasix. She reports things have progressively worsened over the past 9 months while she has been stuck in atrial fibrillation. She does report feeling much improved immediately after cardioversion while in sinus rhythm.   Review of systems complete and found to be negative unless listed in HPI.   EP Information / Studies Reviewed:    EKG is not ordered today. EKG from 09/22/23 reviewed which showed atrial flutter.      Echo 12/07/20:  LV ejection fraction 60 to 65%.  Moderate asymmetric LVH.  Grade 1 diastolic dysfunction. Normal RV size and  function. Moderate MAC.  Mild aortic valve sclerosis without stenosis.  No significant valvular disease. Mild dilatation of the ascending aorta. Normal size left atrium.  Normal size right atrium.  Risk Assessment/Calculations:    CHA2DS2-VASc Score = 4   This indicates a 4.8% annual risk of stroke. The patient's score is based upon: CHF History: 1 HTN History: 1 Diabetes History: 0 Stroke History: 0 Vascular Disease History: 0 Age Score: 1 Gender Score: 1        Physical Exam:   VS:  BP (!) 158/120   Pulse (!) 145   Ht 5\' 2"  (1.575 m)   Wt 215 lb (97.5 kg)   SpO2 95%   BMI 39.32 kg/m    Wt Readings from Last 3 Encounters:  10/16/23 215 lb (97.5 kg)  09/22/23 212 lb (96.2 kg)  09/01/23 217 lb (98.4 kg)     GEN: Well nourished, well developed in no acute distress NECK: No JVD; No carotid bruits CARDIAC: Tachycardic and irregular RESPIRATORY:  Clear to auscultation without rales, wheezing or rhonchi  ABDOMEN: Soft, non-tender, non-distended EXTREMITIES:  No edema; No deformity   ASSESSMENT AND PLAN:   Anita Stanton is a 67 y.o. female with h/o persistent atrial fibrillation, chronic systolic and diastolic heart failure likely secondary to ischemic cardiomyopathy (although ischemic evaluation never done), HTN seen today for Electrophysiology evaluation of atrial fibrillation and flutter at the request of Jodelle Red, MD.   #Atypical atrial flutter: #Persistent atrial fibrillation: Unfortunately she has persistent atrial fibrillation despite Tikosyn.  She has had multiple cardioversions with ERAF. She had previously been hesitant about RF ablation but she is interested in PFA ablation.  - Stop  diltiazem. She reports HF symptoms have worsened since starting. If this is the case, then I suspect her EF may have dropped again in setting of AF w/ RVR.   - Stop dofetilide. This is not working to maintain sinus rhythm. ECG in sinus after DCCV showed QTC of so  unable to increase dose.  - Start amiodarone 400mg  BID x 14 days, then 200mg  daily thereafter. We will check LFTs, TFTs, and PFTs. - DCCV in 2 weeks after amio loading.  - I will see her back in 1 month. If she is able to maintain NSR after DCCV with amiodarone on board then we have discussed possibility of catheter ablation. She is at the upper range of BMI cutoff for ablation and will continue to work on weight loss.   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 -Continue Eliquis.  #Chronic systolic and diastolic heart failure: Initially diagnosed in the setting of atrial fibrillation with RVR.  Likely tachymediated, his LVEF had recovered on echo in 11/2020.  Unfortunately, patient does not have any recent imaging.  Throughout much of this year she has had recurrent atrial fibrillation/flutter and symptoms of congestive heart failure. -Reassess LVEF with echocardiogram.   -If LVEF is low on echocardiogram, this could once again represent tachy-induced cardiomyopathy, but patient has never had an ischemic evaluation.  Prior to taking for ablation with general anesthesia she will need some sort of ischemic evaluation. - Stop diltiazem. She reports HF symptoms have worsened since starting. If this is the case, then I suspect her EF may have dropped again in setting of AF w/ RVR.    #Obesity: -Encouraged weight loss.   #Hypertension -Above goal today.  Recommend checking blood pressures 1-2 times per week at home and recording the values.  Recommend bringing these recordings to the primary care physician.   Follow up with Dr. Jimmey Ralph in 4 weeks   Total time of encounter: 66 minutes total time of encounter, including chart review, face-to-face patient care, coordination of care and counseling regarding high complexity medical decision making.   Signed, Nobie Putnam, MD

## 2023-10-15 NOTE — H&P (View-Only) (Signed)
Electrophysiology Office Note:   Date:  10/16/2023  ID:  Anita Stanton, Anita Stanton [birth year], MRN [id number]  Primary Cardiologist: Jodelle Red, MD Electrophysiologist: None      History of Present Illness:   Anita Stanton is a 67 y.o. female with h/o persistent atrial fibrillation, chronic systolic and diastolic heart failure likely secondary to ischemic cardiomyopathy (although ischemic evaluation never done), HTN seen today for Electrophysiology evaluation of atrial fibrillation and flutter at the request of Jodelle Red, MD.   Patient was initially diagnosed with atrial fibrillation in June 2021.  At that time she was found to have new onset systolic heart failure and atrial fibrillation with rapid ventricular response.  Patient underwent cardioversion on 07/09/2020.  She had recurrence of atrial fibrillation several days later.  She underwent admission for Tikosyn loading in October 2021.  She has been on Tikosyn ever since.  Since January 2024, she has been having more episodes of atrial fibrillation.  This has been complicated by decompensated heart failure.  She recently underwent successful cardioversion on 08/17/2023. But unfortunately went back into atrial fibrillation the next day.  Currently, she reports feeling fatigued. She has exertional dyspnea, PND, orthopnea and lower extremity edema. Symptoms are helped by taking lasix. She reports things have progressively worsened over the past 9 months while she has been stuck in atrial fibrillation. She does report feeling much improved immediately after cardioversion while in sinus rhythm.   Review of systems complete and found to be negative unless listed in HPI.   EP Information / Studies Reviewed:    EKG is not ordered today. EKG from 09/22/23 reviewed which showed atrial flutter.      Echo 12/07/20:  LV ejection fraction 60 to 65%.  Moderate asymmetric LVH.  Grade 1 diastolic dysfunction. Normal RV size and  function. Moderate MAC.  Mild aortic valve sclerosis without stenosis.  No significant valvular disease. Mild dilatation of the ascending aorta. Normal size left atrium.  Normal size right atrium.  Risk Assessment/Calculations:    CHA2DS2-VASc Score = 4   This indicates a 4.8% annual risk of stroke. The patient's score is based upon: CHF History: 1 HTN History: 1 Diabetes History: 0 Stroke History: 0 Vascular Disease History: 0 Age Score: 1 Gender Score: 1        Physical Exam:   VS:  BP (!) 158/120   Pulse (!) 145   Ht 5\' 2"  (1.575 m)   Wt 215 lb (97.5 kg)   SpO2 95%   BMI 39.32 kg/m    Wt Readings from Last 3 Encounters:  10/16/23 215 lb (97.5 kg)  09/22/23 212 lb (96.2 kg)  09/01/23 217 lb (98.4 kg)     GEN: Well nourished, well developed in no acute distress NECK: No JVD; No carotid bruits CARDIAC: Tachycardic and irregular RESPIRATORY:  Clear to auscultation without rales, wheezing or rhonchi  ABDOMEN: Soft, non-tender, non-distended EXTREMITIES:  No edema; No deformity   ASSESSMENT AND PLAN:   Anita Stanton is a 67 y.o. female with h/o persistent atrial fibrillation, chronic systolic and diastolic heart failure likely secondary to ischemic cardiomyopathy (although ischemic evaluation never done), HTN seen today for Electrophysiology evaluation of atrial fibrillation and flutter at the request of Jodelle Red, MD.   #Atypical atrial flutter: #Persistent atrial fibrillation: Unfortunately she has persistent atrial fibrillation despite Tikosyn.  She has had multiple cardioversions with ERAF. She had previously been hesitant about RF ablation but she is interested in PFA ablation.  - Stop  diltiazem. She reports HF symptoms have worsened since starting. If this is the case, then I suspect her EF may have dropped again in setting of AF w/ RVR.   - Stop dofetilide. This is not working to maintain sinus rhythm. ECG in sinus after DCCV showed QTC of so  unable to increase dose.  - Start amiodarone 400mg  BID x 14 days, then 200mg  daily thereafter. We will check LFTs, TFTs, and PFTs. - DCCV in 2 weeks after amio loading.  - I will see her back in 1 month. If she is able to maintain NSR after DCCV with amiodarone on board then we have discussed possibility of catheter ablation. She is at the upper range of BMI cutoff for ablation and will continue to work on weight loss.   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 -Continue Eliquis.  #Chronic systolic and diastolic heart failure: Initially diagnosed in the setting of atrial fibrillation with RVR.  Likely tachymediated, his LVEF had recovered on echo in 11/2020.  Unfortunately, patient does not have any recent imaging.  Throughout much of this year she has had recurrent atrial fibrillation/flutter and symptoms of congestive heart failure. -Reassess LVEF with echocardiogram.   -If LVEF is low on echocardiogram, this could once again represent tachy-induced cardiomyopathy, but patient has never had an ischemic evaluation.  Prior to taking for ablation with general anesthesia she will need some sort of ischemic evaluation. - Stop diltiazem. She reports HF symptoms have worsened since starting. If this is the case, then I suspect her EF may have dropped again in setting of AF w/ RVR.    #Obesity: -Encouraged weight loss.   #Hypertension -Above goal today.  Recommend checking blood pressures 1-2 times per week at home and recording the values.  Recommend bringing these recordings to the primary care physician.   Follow up with Dr. Jimmey Ralph in 4 weeks   Total time of encounter: 66 minutes total time of encounter, including chart review, face-to-face patient care, coordination of care and counseling regarding high complexity medical decision making.   Signed, Nobie Putnam, MD

## 2023-10-16 ENCOUNTER — Encounter: Payer: Self-pay | Admitting: Cardiology

## 2023-10-16 ENCOUNTER — Ambulatory Visit: Payer: Medicare Other | Attending: Cardiology | Admitting: Cardiology

## 2023-10-16 VITALS — BP 158/120 | HR 145 | Ht 62.0 in | Wt 215.0 lb

## 2023-10-16 DIAGNOSIS — I4891 Unspecified atrial fibrillation: Secondary | ICD-10-CM | POA: Insufficient documentation

## 2023-10-16 DIAGNOSIS — I10 Essential (primary) hypertension: Secondary | ICD-10-CM | POA: Diagnosis not present

## 2023-10-16 DIAGNOSIS — D6869 Other thrombophilia: Secondary | ICD-10-CM | POA: Diagnosis present

## 2023-10-16 DIAGNOSIS — I484 Atypical atrial flutter: Secondary | ICD-10-CM | POA: Insufficient documentation

## 2023-10-16 DIAGNOSIS — I4819 Other persistent atrial fibrillation: Secondary | ICD-10-CM | POA: Diagnosis not present

## 2023-10-16 MED ORDER — AMIODARONE HCL 200 MG PO TABS: ORAL_TABLET | ORAL | 3 refills | Status: DC

## 2023-10-16 NOTE — Patient Instructions (Signed)
Medication Instructions:  Your physician has recommended you make the following change in your medication:  1) STOP Cardizem (diltiazem)  2) STOP Tikosyn (dofetilide)  3) On Monday, October 21st: START taking amiodarone 400 mg twice daily for two weeks, then decrease to 200 mg daily   *If you need a refill on your cardiac medications before your next appointment, please call your pharmacy*  Lab Work: TODAY: BMET and CBC   If you have labs (blood work) drawn today and your tests are completely normal, you will receive your results only by: MyChart Message (if you have MyChart) OR A paper copy in the mail If you have any lab test that is abnormal or we need to change your treatment, we will call you to review the results.  Testing/Procedures: Your physician has requested that you have an echocardiogram. Echocardiography is a painless test that uses sound waves to create images of your heart. It provides your doctor with information about the size and shape of your heart and how well your heart's chambers and valves are working. This procedure takes approximately one hour. There are no restrictions for this procedure. Please do NOT wear cologne, perfume, aftershave, or lotions (deodorant is allowed). Please arrive 15 minutes prior to your appointment time.  Your physician has recommended that you have a Cardioversion (DCCV). Electrical Cardioversion uses a jolt of electricity to your heart either through paddles or wired patches attached to your chest. This is a controlled, usually prescheduled, procedure. Defibrillation is done under light anesthesia in the hospital, and you usually go home the day of the procedure. This is done to get your heart back into a normal rhythm. You are not awake for the procedure. Please see the instruction sheet given to you today.  Follow-Up: At [facility], you and your health needs are our priority.  As part of our continuing mission to provide you  with exceptional heart care, we have created designated Provider Care Teams.  These Care Teams include your primary Cardiologist (physician) and Advanced Practice Providers (APPs -  Physician Assistants and Nurse Practitioners) who all work together to provide you with the care you need, when you need it.    Your next appointment:   4 weeks  Provider:   Nobie Putnam, MD

## 2023-10-17 LAB — CBC
Hematocrit: 40.3 % (ref 34.0–46.6)
Hemoglobin: 12.3 g/dL (ref 11.1–15.9)
MCH: 27.1 pg (ref 26.6–33.0)
MCHC: 30.5 g/dL — ABNORMAL LOW (ref 31.5–35.7)
MCV: 89 fL (ref 79–97)
Platelets: 257 10*3/uL (ref 150–450)
RBC: 4.54 x10E6/uL (ref 3.77–5.28)
RDW: 15.5 % — ABNORMAL HIGH (ref 11.7–15.4)
WBC: 8.7 10*3/uL (ref 3.4–10.8)

## 2023-10-17 LAB — COMPREHENSIVE METABOLIC PANEL WITH GFR
ALT: 24 IU/L (ref 0–32)
AST: 21 IU/L (ref 0–40)
Albumin: 3.9 g/dL (ref 3.9–4.9)
Alkaline Phosphatase: 90 IU/L (ref 44–121)
BUN/Creatinine Ratio: 21 (ref 12–28)
BUN: 20 mg/dL (ref 8–27)
Bilirubin Total: 0.6 mg/dL (ref 0.0–1.2)
CO2: 22 mmol/L (ref 20–29)
Calcium: 9.1 mg/dL (ref 8.7–10.3)
Chloride: 102 mmol/L (ref 96–106)
Creatinine, Ser: 0.97 mg/dL (ref 0.57–1.00)
Globulin, Total: 2.5 g/dL (ref 1.5–4.5)
Glucose: 96 mg/dL (ref 70–99)
Potassium: 4.4 mmol/L (ref 3.5–5.2)
Sodium: 141 mmol/L (ref 134–144)
Total Protein: 6.4 g/dL (ref 6.0–8.5)
eGFR: 63 mL/min/1.73

## 2023-10-17 LAB — TSH: TSH: 1.88 u[IU]/mL (ref 0.450–4.500)

## 2023-10-17 LAB — T4, FREE: Free T4: 1.45 ng/dL (ref 0.82–1.77)

## 2023-10-21 ENCOUNTER — Encounter: Payer: Self-pay | Admitting: Cardiology

## 2023-10-22 ENCOUNTER — Encounter: Payer: Self-pay | Admitting: Cardiology

## 2023-10-30 NOTE — Progress Notes (Signed)
Spoke to patient and instructed them to come at 09:15  and to be NPO after 0000. Medications reviewed.   Confirmed that patient will have a ride home and someone to stay with them for 24 hours after the procedure.

## 2023-11-02 ENCOUNTER — Encounter (HOSPITAL_COMMUNITY)
Admission: RE | Disposition: A | Discharge status: Home / Self Care | Payer: Self-pay | Source: Home / Self Care | Attending: Cardiology

## 2023-11-02 ENCOUNTER — Ambulatory Visit (HOSPITAL_COMMUNITY): Payer: Self-pay | Admitting: Anesthesiology

## 2023-11-02 ENCOUNTER — Ambulatory Visit (HOSPITAL_COMMUNITY)
Admission: RE | Admit: 2023-11-02 | Discharge: 2023-11-02 | Disposition: A | Discharge status: Home / Self Care | Payer: Medicare Other | Attending: Cardiology | Admitting: Cardiology

## 2023-11-02 ENCOUNTER — Other Ambulatory Visit: Payer: Self-pay

## 2023-11-02 ENCOUNTER — Ambulatory Visit (HOSPITAL_COMMUNITY): Payer: Medicare Other | Admitting: Anesthesiology

## 2023-11-02 ENCOUNTER — Encounter (HOSPITAL_COMMUNITY): Payer: Self-pay | Admitting: Cardiology

## 2023-11-02 DIAGNOSIS — E669 Obesity, unspecified: Secondary | ICD-10-CM | POA: Insufficient documentation

## 2023-11-02 DIAGNOSIS — Z79899 Other long term (current) drug therapy: Secondary | ICD-10-CM | POA: Insufficient documentation

## 2023-11-02 DIAGNOSIS — I4891 Unspecified atrial fibrillation: Secondary | ICD-10-CM | POA: Diagnosis not present

## 2023-11-02 DIAGNOSIS — I110 Hypertensive heart disease with heart failure: Secondary | ICD-10-CM | POA: Insufficient documentation

## 2023-11-02 DIAGNOSIS — I5032 Chronic diastolic (congestive) heart failure: Secondary | ICD-10-CM | POA: Diagnosis not present

## 2023-11-02 DIAGNOSIS — Z6839 Body mass index (BMI) 39.0-39.9, adult: Secondary | ICD-10-CM | POA: Diagnosis not present

## 2023-11-02 DIAGNOSIS — I255 Ischemic cardiomyopathy: Secondary | ICD-10-CM | POA: Insufficient documentation

## 2023-11-02 DIAGNOSIS — Z7901 Long term (current) use of anticoagulants: Secondary | ICD-10-CM | POA: Insufficient documentation

## 2023-11-02 DIAGNOSIS — Z01818 Encounter for other preprocedural examination: Secondary | ICD-10-CM

## 2023-11-02 DIAGNOSIS — D6869 Other thrombophilia: Secondary | ICD-10-CM | POA: Insufficient documentation

## 2023-11-02 DIAGNOSIS — I484 Atypical atrial flutter: Secondary | ICD-10-CM | POA: Diagnosis not present

## 2023-11-02 DIAGNOSIS — I4819 Other persistent atrial fibrillation: Secondary | ICD-10-CM

## 2023-11-02 HISTORY — PX: CARDIOVERSION: SHX1299

## 2023-11-02 SURGERY — CARDIOVERSION
Anesthesia: General

## 2023-11-02 MED ORDER — SODIUM CHLORIDE 0.9 % IV SOLN: INTRAVENOUS | Status: DC

## 2023-11-02 MED ORDER — PROPOFOL 10 MG/ML IV BOLUS
INTRAVENOUS | Status: DC | PRN
  Administered 2023-11-02: 60 mg via INTRAVENOUS

## 2023-11-02 SURGICAL SUPPLY — 1 items: PAD DEFIB RADIO PHYSIO CONN (PAD) ×2 IMPLANT

## 2023-11-02 NOTE — Interval H&P Note (Signed)
History and Physical Interval Note:  11/02/2023 10:07 AM  Anita Stanton  has presented today for surgery, with the diagnosis of afib.  The various methods of treatment have been discussed with the patient and family. After consideration of risks, benefits and other options for treatment, the patient has consented to  Procedure(s): CARDIOVERSION (N/A) as a surgical intervention.  The patient's history has been reviewed, patient examined, no change in status, stable for surgery.  I have reviewed the patient's chart and labs.  Questions were answered to the patient's satisfaction.     Little Ishikawa

## 2023-11-02 NOTE — Transfer of Care (Signed)
Immediate Anesthesia Transfer of Care Note  Patient: Anita Stanton  Procedure(s) Performed: CARDIOVERSION  Patient Location: PACU  Anesthesia Type:General  Level of Consciousness: awake and alert   Airway & Oxygen Therapy: Patient Spontanous Breathing and Patient connected to nasal cannula oxygen  Post-op Assessment: Report given to RN and Post -op Vital signs reviewed and stable  Post vital signs: Reviewed and stable  Last Vitals:  Vitals Value Taken Time  BP    Temp    Pulse 89 11/02/23 1040  Resp 19 11/02/23 1040  SpO2 96 % 11/02/23 1040  Vitals shown include unfiled device data.  Last Pain:  Vitals:   11/02/23 0941  TempSrc:   PainSc: 0-No pain         Complications: No notable events documented.

## 2023-11-02 NOTE — Anesthesia Postprocedure Evaluation (Signed)
Anesthesia Post Note  Patient: Anita Stanton  Procedure(s) Performed: CARDIOVERSION     Patient location during evaluation: PACU Anesthesia Type: General Level of consciousness: awake and alert Pain management: pain level controlled Vital Signs Assessment: post-procedure vital signs reviewed and stable Respiratory status: spontaneous breathing, nonlabored ventilation, respiratory function stable and patient connected to nasal cannula oxygen Cardiovascular status: blood pressure returned to baseline and stable Postop Assessment: no apparent nausea or vomiting Anesthetic complications: no   No notable events documented.  Last Vitals:  Vitals:   11/02/23 1110 11/02/23 1115  BP: (!) 142/96 (!) 145/102  Pulse: (!) 57 (!) 55  Resp: 15 18  Temp:    SpO2: 99% 99%    Last Pain:  Vitals:   11/02/23 1054  TempSrc: Temporal  PainSc: 0-No pain                 Gladies Sofranko

## 2023-11-02 NOTE — Anesthesia Preprocedure Evaluation (Addendum)
Anesthesia Evaluation  Patient identified by MRN, date of birth, ID band Patient awake    Reviewed: Allergy & Precautions, H&P , NPO status , Patient's Chart, lab work & pertinent test results  Airway Mallampati: III  TM Distance: <3 FB Neck ROM: Full   Comment: ANTERIOR   Dental no notable dental hx. (+) Teeth Intact, Dental Advisory Given   Pulmonary neg pulmonary ROS   Pulmonary exam normal breath sounds clear to auscultation       Cardiovascular hypertension, Pt. on medications negative cardio ROS Normal cardiovascular exam+ dysrhythmias (eliquis) Atrial Fibrillation  Rhythm:Regular Rate:Normal  Hx/o demand ischemia On Pacerone  EKG 10/16/23 Atrial fibrillation with RVR 143/min, LAD  Echo 12/07/20 1. Left ventricular ejection fraction, by estimation, is 60 to 65%. The  left ventricle has normal function. The left ventricle has no regional  wall motion abnormalities. There is moderate asymmetric left ventricular  hypertrophy of the septal segment.  Left ventricular diastolic parameters are consistent with Grade I  diastolic dysfunction (impaired relaxation). Elevated left ventricular  end-diastolic pressure.   2. Right ventricular systolic function is normal. The right ventricular  size is normal. Tricuspid regurgitation signal is inadequate for assessing  PA pressure.   3. The mitral valve is normal in structure. Mild mitral valve  regurgitation. No evidence of mitral stenosis. Moderate mitral annular  calcification.   4. The aortic valve is tricuspid. Aortic valve regurgitation is not  visualized. Mild aortic valve sclerosis is present, with no evidence of  aortic valve stenosis.   5. Aortic dilatation noted. There is mild dilatation of the ascending  aorta, measuring 41 mm.   6. The inferior vena cava is normal in size with greater than 50%  respiratory variability, suggesting right atrial pressure of 3 mmHg.       Neuro/Psych negative neurological ROS  negative psych ROS   GI/Hepatic negative GI ROS, Neg liver ROS,,,  Endo/Other  negative endocrine ROS  Morbid obesityBMI 40   Renal/GU negative Renal ROS  negative genitourinary   Musculoskeletal negative musculoskeletal ROS (+)    Abdominal   Peds negative pediatric ROS (+)  Hematology negative hematology ROS (+) Blood dyscrasia Eliquis therapy   Anesthesia Other Findings   Reproductive/Obstetrics negative OB ROS                             Anesthesia Physical Anesthesia Plan  ASA: 3  Anesthesia Plan: General   Post-op Pain Management:    Induction: Intravenous  PONV Risk Score and Plan: Treatment may vary due to age or medical condition and Propofol infusion  Airway Management Planned: Natural Airway and Mask  Additional Equipment: None  Intra-op Plan:   Post-operative Plan:   Informed Consent:   Plan Discussed with: Anesthesiologist and CRNA  Anesthesia Plan Comments:        Anesthesia Quick Evaluation

## 2023-11-02 NOTE — CV Procedure (Signed)
Procedure:   DCCV  Indication:  Symptomatic atrial fibrillation  Procedure Note:  The patient signed informed consent.  They have had had therapeutic anticoagulation with Eliquis greater than 3 weeks.  Anesthesia was administered by Dr. Tacy Dura.  Adequate airway was maintained throughout and vital followed per protocol.  They were cardioverted x 1 with 200J of biphasic synchronized energy.  They converted to NSR with rate 50s.  There were no apparent complications.  The patient had normal neuro status and respiratory status post procedure with vitals stable as recorded elsewhere.    Follow up:  They will continue on current medical therapy and follow up with cardiology as scheduled.  Epifanio Lesches, MD 11/02/2023 10:53 AM

## 2023-11-04 ENCOUNTER — Encounter: Payer: Self-pay | Admitting: Cardiology

## 2023-11-05 ENCOUNTER — Encounter: Payer: Self-pay | Admitting: Cardiology

## 2023-11-10 ENCOUNTER — Ambulatory Visit (HOSPITAL_COMMUNITY): Payer: Medicare Other | Attending: Cardiology

## 2023-11-10 DIAGNOSIS — I4819 Other persistent atrial fibrillation: Secondary | ICD-10-CM | POA: Diagnosis not present

## 2023-11-10 DIAGNOSIS — I10 Essential (primary) hypertension: Secondary | ICD-10-CM | POA: Insufficient documentation

## 2023-11-11 LAB — ECHOCARDIOGRAM COMPLETE
Area-P 1/2: 4.37 cm2
MV M vel: 5.06 m/s
MV Peak grad: 102.2 mm[Hg]
P 1/2 time: 538 ms
Radius: 0.8 cm
S' Lateral: 4.1 cm

## 2023-11-23 NOTE — Progress Notes (Unsigned)
Electrophysiology Office Note:   Date:  11/24/2023  ID:  Zetha, Hsiao [birth year], MRN [id number]  Primary Cardiologist: Jodelle Red, MD Electrophysiologist: Nobie Putnam, MD      History of Present Illness:   Anita Stanton is a 67 y.o. 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   Since last being seen in our clinic the patient reports doing continued fatigue and intermittent shortness of breath. She underwent successful cardioversion on 11/02/23 to sinus bradycardia, but went back into atrial fibrillation 2 days later. She reports feeling much better while in normal rhythm. Heart rates back in atrial fibrillation have been predominantly in the low 100s.  Otherwise no new or acute complaints.   Review of systems complete and found to be negative unless listed in HPI.   EP Information / Studies Reviewed:    EKG is ordered today. Personal review as below. Likely atrial flutter with variable block, artifact limiting interpretation of flutter waves, vs atrial fibrillation.      EKG 11/02/23 (Post DCCV)   Echo 11/10/23: IMPRESSIONS   1. Left ventricular ejection fraction, by estimation, is 45 to 50%. The  left ventricle has mildly decreased function. The left ventricle  demonstrates global hypokinesis. There is moderate asymmetric left  ventricular hypertrophy. Left ventricular  diastolic function could not be evaluated.   2. Right ventricular systolic function is normal. The right ventricular  size is normal. There is normal pulmonary artery systolic pressure.   3. Left atrial size was moderately dilated.   4. Right atrial size was mildly dilated.   5. The mitral valve is normal in structure. Moderate mitral valve  regurgitation. No evidence of mitral stenosis. Moderate mitral annular  calcification.   6. The aortic valve is  tricuspid. There is moderate calcification of the  aortic valve. There is mild thickening of the aortic valve. Aortic valve  regurgitation is trivial. Aortic valve sclerosis/calcification is present,  without any evidence of aortic  stenosis.   7. Aortic dilatation noted. There is borderline dilatation of the  ascending aorta, measuring 38 mm.   8. The inferior vena cava is normal in size with <50% respiratory  variability, suggesting right atrial pressure of 8 mmHg.   Risk Assessment/Calculations:    CHA2DS2-VASc Score = 4   This indicates a 4.8% annual risk of stroke. The patient's score is based upon: CHF History: 1 HTN History: 1 Diabetes History: 0 Stroke History: 0 Vascular Disease History: 0 Age Score: 1 Gender Score: 1     Physical Exam:   VS:  BP (!) 140/98   Pulse (!) 122   Ht 5\' 2"  (1.575 m)   Wt 214 lb 6.4 oz (97.3 kg)   SpO2 96%   BMI 39.21 kg/m    Wt Readings from Last 3 Encounters:  11/24/23 214 lb 6.4 oz (97.3 kg)  11/02/23 212 lb (96.2 kg)  10/16/23 215 lb (97.5 kg)     GEN: Well nourished, well developed in no acute distress NECK: No JVD;  CARDIAC: Tachycardic, irregular rhythm.  RESPIRATORY:  Clear to auscultation without rales, wheezing or rhonchi  ABDOMEN: Soft, non-tender, non-distended EXTREMITIES:  No edema; No deformity   ASSESSMENT AND PLAN:   Anita Stanton is a 67 y.o. female with h/o persistent atrial fibrillation, chronic systolic and diastolic heart failure likely secondary to ischemic cardiomyopathy (although ischemic evaluation never done), HTN seen today for Electrophysiology evaluation  of atrial fibrillation and flutter at the request of Jodelle Red, MD.    Unfortunately she has persistent atrial fibrillation despite Tikosyn.  She was started on amiodarone during our last visit. She underwent cardioversion to normal sinus rhythm but had recurrence of AF within 2 days. Rates are predominantly in the low 100s.    #Atypical atrial flutter: #Persistent atrial fibrillation:  - Continue amiodarone.  - Start carvedilol 6.25mg  BID.  - Repeat cardioversion, now that she has been adequately loaded with oral amiodarone.  - Ablation would give her the best chance at maintaining normal sinus rhythm. Discussed treatment options extensively today for AF including ablation vs abandoning rhythm control options if repeat cardioversion on amiodarone fails. Discussed risks, recovery and likelihood of success with each treatment strategy. Risk, benefits, and alternatives to EP study and ablation for afib were discussed. Discussed potential need for antiarrhythmic drugs after an initial ablation. The patient understands these risks and would like to think about her options.   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 -Continue Eliquis.   #Chronic systolic and diastolic heart failure: Initially diagnosed in the setting of atrial fibrillation with RVR.  Likely tachymediated, LVEF had recovered on echo in 11/2020. Throughout much of this year she has had recurrent atrial fibrillation/flutter and symptoms of congestive heart failure.  -Repeat echo with EF now 45-50%, this could once again represent tachy-induced cardiomyopathy, but patient has never had an ischemic evaluation.  Prior to taking for ablation with general anesthesia she will need some sort of ischemic evaluation. -Start carvedilol. Continue losartan and furosemide.    #Obesity: -Encouraged weight loss.    #Hypertension -Above goal today. Will start carvedilol. Recommend checking blood pressures 1-2 times per week at home and recording the values.  Recommend bringing these recordings to the primary care physician.  Follow up with Dr. Jimmey Ralph in 3 months   Total time of encounter: 70 minutes total time of encounter, including chart review, face-to-face patient care, coordination of care and counseling  regarding high complexity medical decision making.   Signed, Nobie Putnam, MD

## 2023-11-23 NOTE — H&P (View-Only) (Signed)
 Electrophysiology Office Note:   Date:  11/24/2023  ID:  Anita, Stanton [birth year], MRN [id number]  Primary Cardiologist: Jodelle Red, MD Electrophysiologist: Nobie Putnam, MD      History of Present Illness:   Anita Stanton is a 67 y.o. 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   Since last being seen in our clinic the patient reports doing continued fatigue and intermittent shortness of breath. She underwent successful cardioversion on 11/02/23 to sinus bradycardia, but went back into atrial fibrillation 2 days later. She reports feeling much better while in normal rhythm. Heart rates back in atrial fibrillation have been predominantly in the low 100s.  Otherwise no new or acute complaints.   Review of systems complete and found to be negative unless listed in HPI.   EP Information / Studies Reviewed:    EKG is ordered today. Personal review as below. Likely atrial flutter with variable block, artifact limiting interpretation of flutter waves, vs atrial fibrillation.      EKG 11/02/23 (Post DCCV)   Echo 11/10/23: IMPRESSIONS   1. Left ventricular ejection fraction, by estimation, is 45 to 50%. The  left ventricle has mildly decreased function. The left ventricle  demonstrates global hypokinesis. There is moderate asymmetric left  ventricular hypertrophy. Left ventricular  diastolic function could not be evaluated.   2. Right ventricular systolic function is normal. The right ventricular  size is normal. There is normal pulmonary artery systolic pressure.   3. Left atrial size was moderately dilated.   4. Right atrial size was mildly dilated.   5. The mitral valve is normal in structure. Moderate mitral valve  regurgitation. No evidence of mitral stenosis. Moderate mitral annular  calcification.   6. The aortic valve is  tricuspid. There is moderate calcification of the  aortic valve. There is mild thickening of the aortic valve. Aortic valve  regurgitation is trivial. Aortic valve sclerosis/calcification is present,  without any evidence of aortic  stenosis.   7. Aortic dilatation noted. There is borderline dilatation of the  ascending aorta, measuring 38 mm.   8. The inferior vena cava is normal in size with <50% respiratory  variability, suggesting right atrial pressure of 8 mmHg.   Risk Assessment/Calculations:    CHA2DS2-VASc Score = 4   This indicates a 4.8% annual risk of stroke. The patient's score is based upon: CHF History: 1 HTN History: 1 Diabetes History: 0 Stroke History: 0 Vascular Disease History: 0 Age Score: 1 Gender Score: 1     Physical Exam:   VS:  BP (!) 140/98   Pulse (!) 122   Ht 5\' 2"  (1.575 m)   Wt 214 lb 6.4 oz (97.3 kg)   SpO2 96%   BMI 39.21 kg/m    Wt Readings from Last 3 Encounters:  11/24/23 214 lb 6.4 oz (97.3 kg)  11/02/23 212 lb (96.2 kg)  10/16/23 215 lb (97.5 kg)     GEN: Well nourished, well developed in no acute distress NECK: No JVD;  CARDIAC: Tachycardic, irregular rhythm.  RESPIRATORY:  Clear to auscultation without rales, wheezing or rhonchi  ABDOMEN: Soft, non-tender, non-distended EXTREMITIES:  No edema; No deformity   ASSESSMENT AND PLAN:   Anita Stanton is a 67 y.o. female with h/o persistent atrial fibrillation, chronic systolic and diastolic heart failure likely secondary to ischemic cardiomyopathy (although ischemic evaluation never done), HTN seen today for Electrophysiology evaluation  of atrial fibrillation and flutter at the request of Jodelle Red, MD.    Unfortunately she has persistent atrial fibrillation despite Tikosyn.  She was started on amiodarone during our last visit. She underwent cardioversion to normal sinus rhythm but had recurrence of AF within 2 days. Rates are predominantly in the low 100s.    #Atypical atrial flutter: #Persistent atrial fibrillation:  - Continue amiodarone.  - Start carvedilol 6.25mg  BID.  - Repeat cardioversion, now that she has been adequately loaded with oral amiodarone.  - Ablation would give her the best chance at maintaining normal sinus rhythm. Discussed treatment options extensively today for AF including ablation vs abandoning rhythm control options if repeat cardioversion on amiodarone fails. Discussed risks, recovery and likelihood of success with each treatment strategy. Risk, benefits, and alternatives to EP study and ablation for afib were discussed. Discussed potential need for antiarrhythmic drugs after an initial ablation. The patient understands these risks and would like to think about her options.   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 -Continue Eliquis.   #Chronic systolic and diastolic heart failure: Initially diagnosed in the setting of atrial fibrillation with RVR.  Likely tachymediated, LVEF had recovered on echo in 11/2020. Throughout much of this year she has had recurrent atrial fibrillation/flutter and symptoms of congestive heart failure.  -Repeat echo with EF now 45-50%, this could once again represent tachy-induced cardiomyopathy, but patient has never had an ischemic evaluation.  Prior to taking for ablation with general anesthesia she will need some sort of ischemic evaluation. -Start carvedilol. Continue losartan and furosemide.    #Obesity: -Encouraged weight loss.    #Hypertension -Above goal today. Will start carvedilol. Recommend checking blood pressures 1-2 times per week at home and recording the values.  Recommend bringing these recordings to the primary care physician.  Follow up with Dr. Jimmey Ralph in 3 months   Total time of encounter: 70 minutes total time of encounter, including chart review, face-to-face patient care, coordination of care and counseling  regarding high complexity medical decision making.   Signed, Nobie Putnam, MD

## 2023-11-24 ENCOUNTER — Ambulatory Visit: Payer: Medicare Other | Attending: Cardiology | Admitting: Cardiology

## 2023-11-24 ENCOUNTER — Encounter: Payer: Self-pay | Admitting: Cardiology

## 2023-11-24 VITALS — BP 140/98 | HR 122 | Ht 62.0 in | Wt 214.4 lb

## 2023-11-24 DIAGNOSIS — Z09 Encounter for follow-up examination after completed treatment for conditions other than malignant neoplasm: Secondary | ICD-10-CM | POA: Diagnosis not present

## 2023-11-24 DIAGNOSIS — I484 Atypical atrial flutter: Secondary | ICD-10-CM | POA: Diagnosis not present

## 2023-11-24 DIAGNOSIS — I10 Essential (primary) hypertension: Secondary | ICD-10-CM | POA: Insufficient documentation

## 2023-11-24 DIAGNOSIS — D6869 Other thrombophilia: Secondary | ICD-10-CM | POA: Insufficient documentation

## 2023-11-24 DIAGNOSIS — I5042 Chronic combined systolic (congestive) and diastolic (congestive) heart failure: Secondary | ICD-10-CM | POA: Insufficient documentation

## 2023-11-24 DIAGNOSIS — I4819 Other persistent atrial fibrillation: Secondary | ICD-10-CM | POA: Diagnosis present

## 2023-11-24 MED ORDER — CARVEDILOL 6.25 MG PO TABS: 6.2500 mg | ORAL_TABLET | Freq: Two times a day (BID) | ORAL | 3 refills | Status: DC

## 2023-11-24 NOTE — Patient Instructions (Addendum)
Medication Instructions:  Your physician has recommended you make the following change in your medication:  1) START taking carvedilol 6.25 mg twice daily   *If you need a refill on your cardiac medications before your next appointment, please call your pharmacy*  Lab Work: TODAY: BMET, CBC  If you have labs (blood work) drawn today and your tests are completely normal, you will receive your results only by: MyChart Message (if you have MyChart) OR A paper copy in the mail If you have any lab test that is abnormal or we need to change your treatment, we will call you to review the results.  Testing/Procedures: Your physician has recommended that you have a Cardioversion (DCCV). Electrical Cardioversion uses a jolt of electricity to your heart either through paddles or wired patches attached to your chest. This is a controlled, usually prescheduled, procedure. Defibrillation is done under light anesthesia in the hospital, and you usually go home the day of the procedure. This is done to get your heart back into a normal rhythm. You are not awake for the procedure. Please see the instruction sheet given to you today.   Follow-Up: At [facility], you and your health needs are our priority.  As part of our continuing mission to provide you with exceptional heart care, we have created designated Provider Care Teams.  These Care Teams include your primary Cardiologist (physician) and Advanced Practice Providers (APPs -  Physician Assistants and Nurse Practitioners) who all work together to provide you with the care you need, when you need it.  Your next appointment:   3 months  Provider:   You may see Nobie Putnam, MD or one of the following Advanced Practice Providers on your designated Care Team:   Francis Dowse, [place] [address] [place], [place] Sherie Don, NP Canary Brim, NP

## 2023-11-25 LAB — CBC
Hematocrit: 41.4 % (ref 34.0–46.6)
Hemoglobin: 12.9 g/dL (ref 11.1–15.9)
MCH: 28 pg (ref 26.6–33.0)
MCHC: 31.2 g/dL — ABNORMAL LOW (ref 31.5–35.7)
MCV: 90 fL (ref 79–97)
Platelets: 252 10*3/uL (ref 150–450)
RBC: 4.61 x10E6/uL (ref 3.77–5.28)
RDW: 15.2 % (ref 11.7–15.4)
WBC: 7.4 10*3/uL (ref 3.4–10.8)

## 2023-11-25 LAB — BASIC METABOLIC PANEL
BUN/Creatinine Ratio: 25 (ref 12–28)
BUN: 24 mg/dL (ref 8–27)
CO2: 26 mmol/L (ref 20–29)
Calcium: 9.4 mg/dL (ref 8.7–10.3)
Chloride: 103 mmol/L (ref 96–106)
Creatinine, Ser: 0.95 mg/dL (ref 0.57–1.00)
Glucose: 107 mg/dL — ABNORMAL HIGH (ref 70–99)
Potassium: 4.2 mmol/L (ref 3.5–5.2)
Sodium: 141 mmol/L (ref 134–144)
eGFR: 65 mL/min/{1.73_m2} (ref >59)

## 2023-11-30 ENCOUNTER — Encounter: Payer: Self-pay | Admitting: Cardiology

## 2023-12-07 ENCOUNTER — Encounter: Payer: Self-pay | Admitting: Cardiology

## 2023-12-07 NOTE — Progress Notes (Signed)
 Spoke to patient and instructed them to come at 10:30  and to be NPO after 0000. Medications reviewed.   Confirmed that patient will have a ride home and someone to stay with them for 24 hours after the procedure.

## 2023-12-08 ENCOUNTER — Other Ambulatory Visit: Payer: Self-pay

## 2023-12-08 ENCOUNTER — Ambulatory Visit (HOSPITAL_COMMUNITY): Payer: Medicare Other | Admitting: Anesthesiology

## 2023-12-08 ENCOUNTER — Encounter (HOSPITAL_COMMUNITY)
Admission: RE | Disposition: A | Discharge status: Home / Self Care | Payer: Self-pay | Source: Ambulatory Visit | Attending: Cardiovascular Disease

## 2023-12-08 ENCOUNTER — Ambulatory Visit (HOSPITAL_COMMUNITY)
Admission: RE | Admit: 2023-12-08 | Discharge: 2023-12-08 | Disposition: A | Discharge status: Home / Self Care | Payer: Medicare Other | Source: Ambulatory Visit | Attending: Cardiovascular Disease | Admitting: Cardiovascular Disease

## 2023-12-08 ENCOUNTER — Encounter (HOSPITAL_COMMUNITY): Payer: Self-pay | Admitting: Cardiovascular Disease

## 2023-12-08 DIAGNOSIS — E669 Obesity, unspecified: Secondary | ICD-10-CM | POA: Insufficient documentation

## 2023-12-08 DIAGNOSIS — Z7901 Long term (current) use of anticoagulants: Secondary | ICD-10-CM | POA: Diagnosis not present

## 2023-12-08 DIAGNOSIS — Z6839 Body mass index (BMI) 39.0-39.9, adult: Secondary | ICD-10-CM | POA: Diagnosis not present

## 2023-12-08 DIAGNOSIS — D6869 Other thrombophilia: Secondary | ICD-10-CM | POA: Diagnosis not present

## 2023-12-08 DIAGNOSIS — I484 Atypical atrial flutter: Secondary | ICD-10-CM | POA: Insufficient documentation

## 2023-12-08 DIAGNOSIS — Z006 Encounter for examination for normal comparison and control in clinical research program: Secondary | ICD-10-CM

## 2023-12-08 DIAGNOSIS — I5042 Chronic combined systolic (congestive) and diastolic (congestive) heart failure: Secondary | ICD-10-CM | POA: Insufficient documentation

## 2023-12-08 DIAGNOSIS — I4891 Unspecified atrial fibrillation: Secondary | ICD-10-CM | POA: Diagnosis not present

## 2023-12-08 DIAGNOSIS — I110 Hypertensive heart disease with heart failure: Secondary | ICD-10-CM | POA: Diagnosis not present

## 2023-12-08 DIAGNOSIS — I4819 Other persistent atrial fibrillation: Secondary | ICD-10-CM | POA: Insufficient documentation

## 2023-12-08 HISTORY — PX: CARDIOVERSION: EP1203

## 2023-12-08 SURGERY — CARDIOVERSION (CATH LAB)
Anesthesia: General

## 2023-12-08 MED ORDER — LIDOCAINE 2% (20 MG/ML) 5 ML SYRINGE
INTRAMUSCULAR | Status: DC | PRN
  Administered 2023-12-08: 100 mg via INTRAVENOUS

## 2023-12-08 MED ORDER — SODIUM CHLORIDE 0.9% FLUSH: 3.0000 mL | Freq: Two times a day (BID) | INTRAVENOUS | Status: DC

## 2023-12-08 MED ORDER — SODIUM CHLORIDE 0.9 % IV SOLN: 250.0000 mL | INTRAVENOUS | Status: DC | PRN

## 2023-12-08 MED ORDER — SODIUM CHLORIDE 0.9% FLUSH: 3.0000 mL | INTRAVENOUS | Status: DC | PRN

## 2023-12-08 MED ORDER — PROPOFOL 10 MG/ML IV BOLUS
INTRAVENOUS | Status: DC | PRN
  Administered 2023-12-08: 60 mg via INTRAVENOUS

## 2023-12-08 SURGICAL SUPPLY — 1 items: PAD DEFIB RADIO PHYSIO CONN (PAD) ×2 IMPLANT

## 2023-12-08 NOTE — Anesthesia Preprocedure Evaluation (Signed)
Anesthesia Evaluation  Patient identified by MRN, date of birth, ID band Patient awake    Reviewed: Allergy & Precautions, NPO status , Patient's Chart, lab work & pertinent test results, reviewed documented beta blocker date and time   History of Anesthesia Complications Negative for: history of anesthetic complications  Airway Mallampati: II  TM Distance: >3 FB     Dental no notable dental hx.    Pulmonary neg COPD, neg PE   breath sounds clear to auscultation       Cardiovascular hypertension, +CHF  + dysrhythmias Atrial Fibrillation  Rhythm:Irregular Rate:Normal  LVEF 45-50%, mod MR   Neuro/Psych neg Seizures    GI/Hepatic ,neg GERD  ,,(+) neg Cirrhosis        Endo/Other    Renal/GU      Musculoskeletal   Abdominal   Peds  Hematology   Anesthesia Other Findings   Reproductive/Obstetrics                              Anesthesia Physical Anesthesia Plan  ASA: 3  Anesthesia Plan: General   Post-op Pain Management:    Induction: Intravenous  PONV Risk Score and Plan: 2 and Propofol infusion  Airway Management Planned:   Additional Equipment:   Intra-op Plan:   Post-operative Plan:   Informed Consent: I have reviewed the patients History and Physical, chart, labs and discussed the procedure including the risks, benefits and alternatives for the proposed anesthesia with the patient or authorized representative who has indicated his/her understanding and acceptance.       Plan Discussed with: CRNA  Anesthesia Plan Comments:          Anesthesia Quick Evaluation

## 2023-12-08 NOTE — Transfer of Care (Signed)
Immediate Anesthesia Transfer of Care Note  Patient: Anita Stanton  Procedure(s) Performed: CARDIOVERSION  Patient Location: Cath Lab  Anesthesia Type:General  Level of Consciousness: drowsy and patient cooperative  Airway & Oxygen Therapy: Patient Spontanous Breathing and Patient connected to nasal cannula oxygen  Post-op Assessment: Report given to RN and Post -op Vital signs reviewed and stable  Post vital signs: Reviewed and stable  Last Vitals:  Vitals Value Taken Time  BP 143/99 12/08/23 1342  Temp    Pulse 54 12/08/23 1343  Resp 19 12/08/23 1343  SpO2 90 % 12/08/23 1343  Vitals shown include unfiled device data.  Last Pain:  Vitals:   12/08/23 1101  TempSrc: Temporal         Complications: No notable events documented.

## 2023-12-08 NOTE — Interval H&P Note (Signed)
History and Physical Interval Note:  12/08/2023 11:50 AM  Anita Stanton  has presented today for surgery, with the diagnosis of AFIB.  The various methods of treatment have been discussed with the patient and family. After consideration of risks, benefits and other options for treatment, the patient has consented to  Procedure(s): CARDIOVERSION (N/A) as a surgical intervention.  The patient's history has been reviewed, patient examined, no change in status, stable for surgery.  I have reviewed the patient's chart and labs.  Questions were answered to the patient's satisfaction.     Chilton Si, MD

## 2023-12-08 NOTE — Anesthesia Postprocedure Evaluation (Signed)
Anesthesia Post Note  Patient: Anita Stanton  Procedure(s) Performed: CARDIOVERSION     Patient location during evaluation: PACU Anesthesia Type: General Level of consciousness: awake and alert Pain management: pain level controlled Vital Signs Assessment: post-procedure vital signs reviewed and stable Respiratory status: spontaneous breathing, nonlabored ventilation, respiratory function stable and patient connected to nasal cannula oxygen Cardiovascular status: blood pressure returned to baseline and stable Postop Assessment: no apparent nausea or vomiting Anesthetic complications: no   No notable events documented.  Last Vitals:  Vitals:   12/08/23 1101 12/08/23 1347  BP: (!) 142/108   Pulse: 100   Resp: (!) 26   Temp: 36.7 C 36.7 C  SpO2: 96%     Last Pain:  Vitals:   12/08/23 1347  TempSrc: Temporal  PainSc: 0-No pain                 Mariann Barter

## 2023-12-08 NOTE — Discharge Instructions (Signed)

## 2023-12-08 NOTE — Research (Signed)
[facility] Cardioversion Informed Consent   Subject Name: Anita Stanton  Subject met inclusion and exclusion criteria.  The informed consent form, study requirements and expectations were reviewed with the subject and questions and concerns were addressed prior to the signing of the consent form.  The subject verbalized understanding of the trial requirements.  The subject agreed to participate in the [facility] Cardioversion trial and signed the informed consent on 10/Dec/2024.  The informed consent was obtained prior to performance of any protocol-specific procedures for the subject.  A copy of the signed informed consent was given to the subject and a copy was placed in the subject's medical record.   Kandis Mannan Josey Dettmann

## 2023-12-08 NOTE — CV Procedure (Signed)
Electrical Cardioversion Procedure Note Anita Stanton [id number] April 15, 1956  Procedure: Electrical Cardioversion Indications:  Atrial Fibrillation  Procedure Details Consent: Risks of procedure as well as the alternatives and risks of each were explained to the (patient/caregiver).  Consent for procedure obtained. Time Out: Verified patient identification, verified procedure, site/side was marked, verified correct patient position, special equipment/implants available, medications/allergies/relevent history reviewed, required imaging and test results available.  Performed  Patient placed on cardiac monitor, pulse oximetry, supplemental oxygen as necessary.  Sedation given:  propofol Pacer pads placed anterior and posterior chest.  Cardioverted 1 time(s).  Cardioverted at 300J.  Evaluation Findings: Post procedure EKG shows: NSR Complications: None Patient did tolerate procedure well.   Chilton Si, MD 12/08/2023, 1:42 PM

## 2023-12-11 ENCOUNTER — Ambulatory Visit (HOSPITAL_BASED_OUTPATIENT_CLINIC_OR_DEPARTMENT_OTHER): Payer: Medicare Other | Admitting: Internal Medicine

## 2023-12-11 DIAGNOSIS — I10 Essential (primary) hypertension: Secondary | ICD-10-CM

## 2023-12-11 DIAGNOSIS — I4819 Other persistent atrial fibrillation: Secondary | ICD-10-CM

## 2023-12-11 DIAGNOSIS — I4891 Unspecified atrial fibrillation: Secondary | ICD-10-CM

## 2023-12-11 LAB — PULMONARY FUNCTION TEST
DL/VA % pred: 137 %
DL/VA: 5.83 ml/min/mmHg/L
DLCO cor % pred: 85 %
DLCO cor: 15.06 ml/min/mmHg
DLCO unc % pred: 84 %
DLCO unc: 14.82 ml/min/mmHg
FEF 25-75 Pre: 1.75 L/s
FEF2575-%Pred-Pre: 98 %
FEV1-%Pred-Pre: 56 %
FEV1-Pre: 1.13 L
FEV1FVC-%Pred-Pre: 119 %
FEV6-%Pred-Pre: 48 %
FEV6-Pre: 1.24 L
FEV6FVC-%Pred-Pre: 104 %
FVC-%Pred-Pre: 46 %
FVC-Pre: 1.24 L
Pre FEV1/FVC ratio: 91 %
Pre FEV6/FVC Ratio: 100 %
RV % pred: 65 %
RV: 1.32 L
TLC % pred: 63 %
TLC: 2.91 L

## 2023-12-11 NOTE — Patient Instructions (Signed)
Full PFT Performed Today except for Post Spirometry.

## 2023-12-11 NOTE — Progress Notes (Signed)
Full PFT Performed Today except for Post Spirometry.

## 2024-01-14 ENCOUNTER — Encounter: Payer: Self-pay | Admitting: Cardiology

## 2024-01-18 ENCOUNTER — Other Ambulatory Visit (HOSPITAL_BASED_OUTPATIENT_CLINIC_OR_DEPARTMENT_OTHER): Payer: Self-pay | Admitting: Cardiology

## 2024-01-19 MED ORDER — CARVEDILOL 12.5 MG PO TABS: 12.5000 mg | ORAL_TABLET | Freq: Two times a day (BID) | ORAL | 3 refills | Status: DC

## 2024-03-02 ENCOUNTER — Encounter: Payer: Self-pay | Admitting: Cardiology

## 2024-03-02 ENCOUNTER — Other Ambulatory Visit: Payer: Self-pay

## 2024-03-02 ENCOUNTER — Ambulatory Visit: Payer: Medicare Other | Attending: Cardiology | Admitting: Cardiology

## 2024-03-02 VITALS — BP 140/72 | HR 53 | Ht 61.0 in | Wt 217.0 lb

## 2024-03-02 DIAGNOSIS — I5042 Chronic combined systolic (congestive) and diastolic (congestive) heart failure: Secondary | ICD-10-CM

## 2024-03-02 DIAGNOSIS — I4819 Other persistent atrial fibrillation: Secondary | ICD-10-CM | POA: Diagnosis present

## 2024-03-02 DIAGNOSIS — I10 Essential (primary) hypertension: Secondary | ICD-10-CM

## 2024-03-02 DIAGNOSIS — I484 Atypical atrial flutter: Secondary | ICD-10-CM

## 2024-03-02 DIAGNOSIS — Z79899 Other long term (current) drug therapy: Secondary | ICD-10-CM | POA: Diagnosis present

## 2024-03-02 DIAGNOSIS — D6869 Other thrombophilia: Secondary | ICD-10-CM | POA: Insufficient documentation

## 2024-03-02 NOTE — Progress Notes (Signed)
 Electrophysiology Office Note:   Date:  03/02/2024  ID:  Anita, Stanton 1956/07/11, MRN [id number]  Primary Cardiologist: Anita Red, MD Electrophysiologist: Anita Putnam, MD      History of Present Illness:   Anita Stanton is a 68 y.o. 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 Patient with longstanding history of AF. She had successful cardioversion on 11/02/23 to sinus bradycardia, but went back into atrial fibrillation 2 days later. She reported feeling much better while in normal rhythm. She was started on amiodarone and underwent another cardioversion on 12/08/23.  Discussed the use of AI scribe software for clinical note transcription with the patient, who gave verbal consent to proceed.  History of Present Illness   The patient, with a history of atrial fibrillation, presents for a follow-up visit after a cardioversion in December. She reports episodes of going in and out of normal rhythm, which have been managed by adjusting the dose of her medications, amiodarone and carvedilol. She attributes the occasional episodes of going out of rhythm to periods of stress or lack of rest. She has been primarily in normal rhythm since December. She reports feeling better when in normal rhythm and expresses a preference for not taking a lot of medication.     Review of systems complete and found to be negative unless listed in HPI.   EP Information / Studies Reviewed:    EKG is ordered today. Personal review as below.  EKG Interpretation Date/Time:  Wednesday March 02 2024 11:13:28 EST Ventricular Rate:  53 PR Interval:  194 QRS Duration:  86 QT Interval:  494 QTC Calculation: 463 R Axis:   -57  Text Interpretation: Sinus bradycardia Possible Left atrial enlargement Left anterior fascicular block Nonspecific ST and T wave abnormality  When compared with ECG of 08-Dec-2023 13:46, Nonspecific T wave abnormality no longer evident in Inferior leads T wave inversion no longer evident in Anterior leads Confirmed by Anita Stanton [phone]) on 03/02/2024 11:33:22 AM   EKG 11/02/23 (Post DCCV)   Echo 11/10/23: IMPRESSIONS   1. Left ventricular ejection fraction, by estimation, is 45 to 50%. The  left ventricle has mildly decreased function. The left ventricle  demonstrates global hypokinesis. There is moderate asymmetric left  ventricular hypertrophy. Left ventricular  diastolic function could not be evaluated.   2. Right ventricular systolic function is normal. The right ventricular  size is normal. There is normal pulmonary artery systolic pressure.   3. Left atrial size was moderately dilated.   4. Right atrial size was mildly dilated.   5. The mitral valve is normal in structure. Moderate mitral valve  regurgitation. No evidence of mitral stenosis. Moderate mitral annular  calcification.   6. The aortic valve is tricuspid. There is moderate calcification of the  aortic valve. There is mild thickening of the aortic valve. Aortic valve  regurgitation is trivial. Aortic valve sclerosis/calcification is present,  without any evidence of aortic  stenosis.   7. Aortic dilatation noted. There is borderline dilatation of the  ascending aorta, measuring 38 mm.   8. The inferior vena cava is normal in size with <50% respiratory  variability, suggesting right atrial pressure of 8 mmHg.   Risk Assessment/Calculations:    CHA2DS2-VASc Score = 4   This indicates a 4.8% annual risk of stroke. The patient's score is based upon: CHF History: 1 HTN History: 1 Diabetes History: 0 Stroke History: 0  Vascular Disease History: 0 Age Score: 1 Gender Score: 1     Physical Exam:   VS:  BP (!) 140/72 (BP Location: Left Arm, Patient Position: Sitting, Cuff Size: Large)   Pulse (!) 53   Ht 5\' 1"  (1.549 m)   Wt 217 lb (98.4 kg)   SpO2  98%   BMI 41.00 kg/m    Wt Readings from Last 3 Encounters:  03/02/24 217 lb (98.4 kg)  12/11/23 217 lb (98.4 kg)  12/08/23 215 lb (97.5 kg)     GEN: Well nourished, well developed in no acute distress NECK: No JVD CARDIAC: Bradycardic, regular RESPIRATORY:  Clear to auscultation without rales, wheezing or rhonchi  ABDOMEN: Soft, non-distended EXTREMITIES:  No edema; No deformity   ASSESSMENT AND PLAN:   Anita Stanton is a 68 y.o. female with h/o persistent atrial fibrillation, chronic systolic and diastolic heart failure likely secondary to ischemic cardiomyopathy (although ischemic evaluation never done), HTN seen today for follow up Electrophysiology evaluation of atrial fibrillation and flutter.    Unfortunately she had persistent atrial fibrillation despite Tikosyn. She was transitioned to amiodarone and has been predominantly in normal rhythm since last cardioversion on December 08, 2023.   #Atypical atrial flutter: #Persistent atrial fibrillation:  #High risk medication use: - Continue amiodarone. Repeat LFTs and TSH today. PFTs done 12/11/23. - Continue carvedilol 12.5mg  BID. - We have previously discussed ablation extensively and briefly talked about this again today. Currently, she is doing well on amiodaron and would like to continue with medical management and close monitoring. We can readdress ablation in the future if she fails amiodarone.    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 -Continue Eliquis.   #Chronic systolic and diastolic heart failure: Initially diagnosed in the setting of atrial fibrillation with RVR.  Likely tachymediated, LVEF had recovered on echo in 11/2020. Throughout much of 2024 she has had recurrent atrial fibrillation/flutter and symptoms of congestive heart failure. This has improved since predominantly maintaining sinus on amio.  -Continue carvedilol, losartan and furosemide. Follow up  with Dr. Cristal Deer.   #Hypertension -Above goal today. Recommend checking blood pressures 1-2 times per week at home and recording the values.  Recommend bringing these recordings to the primary care physician.  Follow up with EP APP in 6 months.  Signed, Anita Putnam, MD

## 2024-03-02 NOTE — Patient Instructions (Signed)
 Medication Instructions:  Your physician recommends that you continue on your current medications as directed. Please refer to the Current Medication list given to you today.  *If you need a refill on your cardiac medications before your next appointment, please call your pharmacy*  Lab Work: TODAY: CMET, TSH, T4  Follow-Up: At [facility], you and your health needs are our priority.  As part of our continuing mission to provide you with exceptional heart care, we have created designated Provider Care Teams.  These Care Teams include your primary Cardiologist (physician) and Advanced Practice Providers (APPs -  Physician Assistants and Nurse Practitioners) who all work together to provide you with the care you need, when you need it.  Your next appointment:   6 months  Provider:   You may see Nobie Putnam, MD or one of the following Advanced Practice Providers on your designated Care Team:   Francis Dowse, [place] [address]" [place], [place] Sherie Don, NP Canary Brim, NP

## 2024-03-25 LAB — COMPREHENSIVE METABOLIC PANEL WITH GFR
ALT: 10 IU/L (ref 0–32)
AST: 16 IU/L (ref 0–40)
Albumin: 4.4 g/dL (ref 3.9–4.9)
Alkaline Phosphatase: 79 IU/L (ref 44–121)
BUN/Creatinine Ratio: 23 (ref 12–28)
BUN: 24 mg/dL (ref 8–27)
Bilirubin Total: 0.4 mg/dL (ref 0.0–1.2)
CO2: 25 mmol/L (ref 20–29)
Calcium: 9.4 mg/dL (ref 8.7–10.3)
Chloride: 105 mmol/L (ref 96–106)
Creatinine, Ser: 1.06 mg/dL — ABNORMAL HIGH (ref 0.57–1.00)
Globulin, Total: 2.3 g/dL (ref 1.5–4.5)
Glucose: 83 mg/dL (ref 70–99)
Potassium: 5.1 mmol/L (ref 3.5–5.2)
Sodium: 143 mmol/L (ref 134–144)
Total Protein: 6.7 g/dL (ref 6.0–8.5)
eGFR: 57 mL/min/{1.73_m2} — ABNORMAL LOW (ref >59)

## 2024-03-25 LAB — T4, FREE: Free T4: 1.59 ng/dL (ref 0.82–1.77)

## 2024-03-25 LAB — TSH: TSH: 2.16 u[IU]/mL (ref 0.450–4.500)

## 2024-03-31 ENCOUNTER — Encounter: Payer: Self-pay | Admitting: Cardiology

## 2024-04-16 ENCOUNTER — Other Ambulatory Visit (HOSPITAL_BASED_OUTPATIENT_CLINIC_OR_DEPARTMENT_OTHER): Payer: Self-pay | Admitting: Cardiology

## 2024-05-01 ENCOUNTER — Other Ambulatory Visit (HOSPITAL_BASED_OUTPATIENT_CLINIC_OR_DEPARTMENT_OTHER): Payer: Self-pay | Admitting: Cardiology

## 2024-05-01 DIAGNOSIS — I480 Paroxysmal atrial fibrillation: Secondary | ICD-10-CM

## 2024-05-01 DIAGNOSIS — Z7901 Long term (current) use of anticoagulants: Secondary | ICD-10-CM

## 2024-05-01 DIAGNOSIS — D6869 Other thrombophilia: Secondary | ICD-10-CM

## 2024-05-02 NOTE — Telephone Encounter (Signed)
 Prescription refill request for Eliquis  received. Indication:afib Last office visit:3/25 Scr:1.06  3/25 Age: 68 Weight:98.4  kg  Prescription refilled

## 2024-05-03 ENCOUNTER — Encounter: Payer: Self-pay | Admitting: Cardiology

## 2024-05-03 MED ORDER — AMIODARONE HCL 200 MG PO TABS: ORAL_TABLET | ORAL | 3 refills | Status: DC

## 2024-05-03 NOTE — Telephone Encounter (Signed)
 Pt seen by Daneil Dunker on 03/02/24: #Atypical atrial flutter: #Persistent atrial fibrillation:  #High risk medication use: - Continue amiodarone . Repeat LFTs and TSH today. PFTs done 12/11/23. - Continue carvedilol  12.5mg  BID. - We have previously discussed ablation extensively and briefly talked about this again today. Currently, she is doing well on amiodaron and would like to continue with medical management and close monitoring. We can readdress ablation in the future if she fails amiodarone .   Verified with patient that she is now on 200mg  daily dosing (as prescribed). [facility]. Called that location and they do not have a pharmacy. Called pt back who asked that I send to [facility]. Sent at this time.

## 2024-05-05 ENCOUNTER — Encounter: Payer: Self-pay | Admitting: Cardiology

## 2024-09-01 ENCOUNTER — Encounter: Payer: Self-pay | Admitting: Cardiology

## 2024-09-14 ENCOUNTER — Other Ambulatory Visit: Payer: Self-pay

## 2024-09-14 DIAGNOSIS — I480 Paroxysmal atrial fibrillation: Secondary | ICD-10-CM

## 2024-10-14 ENCOUNTER — Other Ambulatory Visit: Payer: Self-pay | Admitting: Cardiology

## 2024-10-26 LAB — T4, FREE: Free T4: 1.55 ng/dL (ref 0.82–1.77)

## 2024-10-26 LAB — COMPREHENSIVE METABOLIC PANEL WITH GFR
ALT: 12 IU/L (ref 0–32)
AST: 15 IU/L (ref 0–40)
Albumin: 4.3 g/dL (ref 3.9–4.9)
Alkaline Phosphatase: 87 IU/L (ref 49–135)
BUN/Creatinine Ratio: 19 (ref 12–28)
BUN: 16 mg/dL (ref 8–27)
Bilirubin Total: 0.4 mg/dL (ref 0.0–1.2)
CO2: 24 mmol/L (ref 20–29)
Calcium: 9.5 mg/dL (ref 8.7–10.3)
Chloride: 103 mmol/L (ref 96–106)
Creatinine, Ser: 0.83 mg/dL (ref 0.57–1.00)
Globulin, Total: 2.2 g/dL (ref 1.5–4.5)
Glucose: 92 mg/dL (ref 70–99)
Potassium: 5.3 mmol/L — ABNORMAL HIGH (ref 3.5–5.2)
Sodium: 141 mmol/L (ref 134–144)
Total Protein: 6.5 g/dL (ref 6.0–8.5)
eGFR: 76 mL/min/1.73 (ref >59)

## 2024-10-26 LAB — TSH: TSH: 3.12 u[IU]/mL (ref 0.450–4.500)

## 2024-10-30 ENCOUNTER — Ambulatory Visit: Payer: Self-pay | Admitting: Cardiology

## 2025-01-12 ENCOUNTER — Other Ambulatory Visit (HOSPITAL_BASED_OUTPATIENT_CLINIC_OR_DEPARTMENT_OTHER): Payer: Self-pay | Admitting: Cardiology

## 2025-01-12 ENCOUNTER — Other Ambulatory Visit: Payer: Self-pay | Admitting: Cardiology

## 2025-01-12 DIAGNOSIS — Z7901 Long term (current) use of anticoagulants: Secondary | ICD-10-CM

## 2025-01-12 DIAGNOSIS — Z79899 Other long term (current) drug therapy: Secondary | ICD-10-CM

## 2025-01-12 NOTE — Telephone Encounter (Signed)
 Potassium out of Range. Magnesium  overdue  In accordance with refill protocols, please review and address the following requirements before this medication refill can be authorized:  Labs

## 2025-01-12 NOTE — Progress Notes (Unsigned)
 " Electrophysiology Office Note:   Date:  01/13/2025  ID:  Anita Stanton, Anita Stanton [birth year], MRN [id number]  Primary Cardiologist: Shelda Bruckner, MD Primary Heart Failure: None Electrophysiologist: Fonda Kitty, MD      History of Present Illness:   Anita Stanton is a 69 y.o. 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   Since last being seen in our clinic the patient reports she has an episode of AF every 2 weeks that are brief / <30 minutes in nature.  She has been taking Nattokinase to lower her blood pressure. She denies bleeding events on eliquis .  No dry cough, tremor on amiodarone .  She denies chest pain, palpitations, dyspnea, PND, orthopnea, nausea, vomiting, dizziness, syncope, edema, weight gain, or early satiety.   Review of systems complete and found to be negative unless listed in HPI.   EP Information / Studies Reviewed:    EKG is ordered today. Personal review as below.  EKG Interpretation Date/Time:  Friday January 13 2025 09:36:02 EST Ventricular Rate:  58 PR Interval:  206 QRS Duration:  92 QT Interval:  470 QTC Calculation: 461 R Axis:   -23  Text Interpretation: Sinus bradycardia Confirmed by Aniceto Jarvis ([id number]) on 01/13/2025 1:12:38 PM    Arrhythmia / AAD / Pertinent EP Studies AF  DCCV 11/02/23 > ERAF after 2 days  Amiodarone  12/08/23 >   Physical Exam:   VS:  BP (!) 142/80   Pulse (!) 58   Ht 5' 1 (1.549 m)   Wt 233 lb (105.7 kg)   SpO2 99%   BMI 44.02 kg/m    Wt Readings from Last 3 Encounters:  01/13/25 233 lb (105.7 kg)  03/02/24 217 lb (98.4 kg)  12/11/23 217 lb (98.4 kg)     GEN: Well nourished, well developed in no acute distress NECK: No JVD; No carotid bruits CARDIAC: Regular rate and rhythm, no murmurs, rubs, gallops RESPIRATORY:  Clear to auscultation without rales, wheezing or rhonchi  ABDOMEN: Soft,  non-tender, non-distended EXTREMITIES:  No edema; No deformity   Risk Assessment/Calculations:    CHA2DS2-VASc Score = 4   This indicates a 4.8% annual risk of stroke. The patient's score is based upon: CHF History: 1 HTN History: 1 Diabetes History: 0 Stroke History: 0 Vascular Disease History: 0 Age Score: 1 Gender Score: 1      ASSESSMENT AND PLAN:    Persistent Atrial Fibrillation  Atypical Atrial Flutter  High Risk Medication Monitoring: Amiodarone   -continue amiodarone  200 mg daily    -amio labs in 09/2024 > wnl, repeat in 6 months -EKG with SB, stable intervals on amiodarone   -Coreg  12.5 mg BID -OAC for stroke prophylaxis  -no significant symptom burden   -has discussed ablation with Dr. Kitty and elects to continue medication for now, would reconsider in future if she fails amiodarone   Secondary Hypercoagulable State  -continue Eliquis  5mg  BID, dose reviewed and appropriate by age / wt  -called patient back after visit (I had time to review Nattokinase) and cautioned her with use as it may put her at risk of bleeding.  She states she will do her research and take it into consideration.   Combined Systolic & Diastolic Heart Failure  Thought tachy-mediated due to AF, LVEF recovered  -maintaining sinus rhythm paramount in reducing HF symptoms  -GDMT per Cardiology  -follows with Dr. Bruckner  Hypertension  -BP elevated in clinic, discussed monitoring and  reviewing with Cardiology  -1 month refill of losartan  provided to patient, labs ordered by refill nurse Anita Stanton.  Instructed that patient needs Cardiology follow up for refills.    Follow up with EP APP in 6 months for amiodarone  review   Signed, Daphne Barrack, NP-C, AGACNP-BC [facility] - Electrophysiology  01/13/2025, 1:13 PM  "

## 2025-01-13 ENCOUNTER — Ambulatory Visit: Attending: Pulmonary Disease | Admitting: Pulmonary Disease

## 2025-01-13 ENCOUNTER — Encounter (HOSPITAL_BASED_OUTPATIENT_CLINIC_OR_DEPARTMENT_OTHER): Payer: Self-pay | Admitting: *Deleted

## 2025-01-13 ENCOUNTER — Encounter: Payer: Self-pay | Admitting: Pulmonary Disease

## 2025-01-13 VITALS — BP 142/80 | HR 58 | Ht 61.0 in | Wt 233.0 lb

## 2025-01-13 DIAGNOSIS — I480 Paroxysmal atrial fibrillation: Secondary | ICD-10-CM | POA: Insufficient documentation

## 2025-01-13 DIAGNOSIS — Z79899 Other long term (current) drug therapy: Secondary | ICD-10-CM | POA: Insufficient documentation

## 2025-01-13 DIAGNOSIS — I10 Essential (primary) hypertension: Secondary | ICD-10-CM | POA: Diagnosis present

## 2025-01-13 DIAGNOSIS — I4819 Other persistent atrial fibrillation: Secondary | ICD-10-CM | POA: Diagnosis present

## 2025-01-13 DIAGNOSIS — Z7901 Long term (current) use of anticoagulants: Secondary | ICD-10-CM | POA: Diagnosis present

## 2025-01-13 DIAGNOSIS — I5042 Chronic combined systolic (congestive) and diastolic (congestive) heart failure: Secondary | ICD-10-CM | POA: Diagnosis present

## 2025-01-13 DIAGNOSIS — D6869 Other thrombophilia: Secondary | ICD-10-CM | POA: Insufficient documentation

## 2025-01-13 DIAGNOSIS — I484 Atypical atrial flutter: Secondary | ICD-10-CM | POA: Diagnosis not present

## 2025-01-13 MED ORDER — AMIODARONE HCL 200 MG PO TABS: ORAL_TABLET | ORAL | 1 refills | Status: AC

## 2025-01-13 MED ORDER — APIXABAN 5 MG PO TABS: 5.0000 mg | ORAL_TABLET | Freq: Two times a day (BID) | ORAL | 1 refills | Status: AC

## 2025-01-13 MED ORDER — LOSARTAN POTASSIUM 100 MG PO TABS: 100.0000 mg | ORAL_TABLET | Freq: Every day | ORAL | 0 refills | Status: AC

## 2025-01-13 NOTE — Telephone Encounter (Signed)
 Currently at [facility] EP appointment. Will route to see if labs can be ordered for med refill today.

## 2025-01-13 NOTE — Patient Instructions (Signed)
 Medication Instructions:  Your physician recommends that you continue on your current medications as directed. Please refer to the Current Medication list given to you today.  *If you need a refill on your cardiac medications before your next appointment, please call your pharmacy*  Lab Work: None ordered  If you have labs (blood work) drawn today and your tests are completely normal, you will receive your results only by: MyChart Message (if you have MyChart) OR A paper copy in the mail If you have any lab test that is abnormal or we need to change your treatment, we will call you to review the results.  Follow-Up: At [facility], you and your health needs are our priority.  As part of our continuing mission to provide you with exceptional heart care, our providers are all part of one team.  This team includes your primary Cardiologist (physician) and Advanced Practice Providers or APPs (Physician Assistants and Nurse Practitioners) who all work together to provide you with the care you need, when you need it.  Your next appointment:   6 month(s)  Provider:   Creighton Doffing, NP

## 2025-03-09 ENCOUNTER — Ambulatory Visit (HOSPITAL_BASED_OUTPATIENT_CLINIC_OR_DEPARTMENT_OTHER): Admitting: Cardiology
